# Patient Record
Sex: Female | Born: 1960 | Race: White | Hispanic: No | Smoking: Former smoker
Health system: Southern US, Community
[De-identification: ages and names within clinical notes are randomized; demographics above are authoritative.]

## PROBLEM LIST (undated history)

## (undated) DIAGNOSIS — T4145XA Adverse effect of unspecified anesthetic, initial encounter: Secondary | ICD-10-CM

## (undated) DIAGNOSIS — E0590 Thyrotoxicosis, unspecified without thyrotoxic crisis or storm: Secondary | ICD-10-CM

## (undated) DIAGNOSIS — G43109 Migraine with aura, not intractable, without status migrainosus: Secondary | ICD-10-CM

## (undated) DIAGNOSIS — IMO0002 Reserved for concepts with insufficient information to code with codable children: Secondary | ICD-10-CM

## (undated) DIAGNOSIS — T8859XA Other complications of anesthesia, initial encounter: Secondary | ICD-10-CM

## (undated) DIAGNOSIS — J449 Chronic obstructive pulmonary disease, unspecified: Secondary | ICD-10-CM

## (undated) DIAGNOSIS — F419 Anxiety disorder, unspecified: Secondary | ICD-10-CM

## (undated) DIAGNOSIS — E079 Disorder of thyroid, unspecified: Secondary | ICD-10-CM

## (undated) DIAGNOSIS — Z8489 Family history of other specified conditions: Secondary | ICD-10-CM

## (undated) DIAGNOSIS — G4700 Insomnia, unspecified: Secondary | ICD-10-CM

## (undated) DIAGNOSIS — J40 Bronchitis, not specified as acute or chronic: Secondary | ICD-10-CM

## (undated) HISTORY — DX: Disorder of thyroid, unspecified: E07.9

## (undated) HISTORY — DX: Reserved for concepts with insufficient information to code with codable children: IMO0002

## (undated) HISTORY — DX: Insomnia, unspecified: G47.00

## (undated) HISTORY — PX: CHOLECYSTECTOMY: SHX55

## (undated) HISTORY — DX: Migraine with aura, not intractable, without status migrainosus: G43.109

## (undated) HISTORY — PX: TUBAL LIGATION: SHX77

---

## 2004-06-04 ENCOUNTER — Encounter: Admission: RE | Admit: 2004-06-04 | Discharge: 2004-06-04 | Payer: Self-pay | Admitting: Family Medicine

## 2004-07-03 ENCOUNTER — Encounter: Admission: RE | Admit: 2004-07-03 | Discharge: 2004-08-20 | Payer: Self-pay | Admitting: Family Medicine

## 2005-03-31 ENCOUNTER — Emergency Department (HOSPITAL_COMMUNITY): Admission: EM | Admit: 2005-03-31 | Discharge: 2005-03-31 | Payer: Self-pay | Admitting: Emergency Medicine

## 2005-07-17 ENCOUNTER — Encounter: Admission: RE | Admit: 2005-07-17 | Discharge: 2005-07-17 | Payer: Self-pay | Admitting: *Deleted

## 2005-07-21 ENCOUNTER — Encounter: Admission: RE | Admit: 2005-07-21 | Discharge: 2005-07-21 | Payer: Self-pay | Admitting: Surgery

## 2011-06-16 ENCOUNTER — Emergency Department (HOSPITAL_COMMUNITY)
Admission: EM | Admit: 2011-06-16 | Discharge: 2011-06-16 | Disposition: A | Discharge status: Home Health | Payer: Self-pay | Source: Home / Self Care | Attending: Family Medicine | Admitting: Family Medicine

## 2011-06-16 ENCOUNTER — Encounter (HOSPITAL_COMMUNITY): Payer: Self-pay

## 2011-06-16 ENCOUNTER — Other Ambulatory Visit: Payer: Self-pay

## 2011-06-16 DIAGNOSIS — R000 Tachycardia, unspecified: Secondary | ICD-10-CM

## 2011-06-16 DIAGNOSIS — I498 Other specified cardiac arrhythmias: Secondary | ICD-10-CM

## 2011-06-16 DIAGNOSIS — J029 Acute pharyngitis, unspecified: Secondary | ICD-10-CM

## 2011-06-16 LAB — POCT URINALYSIS DIP (DEVICE)
Bilirubin Urine: NEGATIVE
Glucose, UA: NEGATIVE mg/dL
Nitrite: NEGATIVE
Urobilinogen, UA: 0.2 mg/dL (ref 0.0–1.0)
pH: 6 (ref 5.0–8.0)

## 2011-06-16 LAB — T4, FREE: Free T4: 5.15 ng/dL — ABNORMAL HIGH (ref 0.80–1.80)

## 2011-06-16 MED ORDER — FLUTICASONE PROPIONATE 50 MCG/ACT NA SUSP: 2.0000 | Freq: Every day | NASAL | Status: DC

## 2011-06-16 NOTE — ED Provider Notes (Signed)
Sonya Rodriguez is a 51 y.o. female who presents to Urgent Care today for sore throat for 2 weeks with tachycardia.   1) Sore throat starting initially with stuffy nose and cough. However the nasal congestion is mostly resolved as is the cough. However the throat is still mildly sore. It ws improving but worsened today. [facility] where a rapid strep was negative. However she was found to be tachycardiac and asked to present urgent care.   2) Tachycardia. Notes weeks to months of feeling hot all the time. She also notes dry skin and fragile hair. She denies any trouble swallowing or breathing.    PMH reviewed. Smoked for years until 2 weeks ago. Not a doctor goer.  ROS as above otherwise neg Medications reviewed. No current facility-administered medications for this encounter.   Current Outpatient Prescriptions  Medication Sig Dispense Refill  . acetaminophen (TYLENOL) 500 MG tablet Take 1,000 mg by mouth as needed.      . Pseudoeph-Doxylamine-DM-APAP (NYQUIL PO) Take by mouth as needed.        Exam:  BP 129/73  Pulse 112  Temp(Src) 98.3 F (36.8 C) (Oral)  Resp 22  SpO2 98% Gen: Well NAD HEENT: EOMI,  MMM, post pharynx sig for cobblestoning. No exudate.  Large smooth thyroid symmetric.  Lungs: CTABL Nl WOB Heart: Tachy but regular no MRG Abd: NABS, NT, ND Exts: Non edematous BL  LE, warm and well perfused. Legs equal in size  EKG:  Sinus tach at 112 beats per minute no ST segment changes compared with prior EKG. Urine Dipstick no glucose  Assessment and Plan: 51 yo woman with sore throat likely do to posterior nasal drip as patient has cobblestoning. Plan to treat this with Flonase nasal spray.  More concerning is her sinus tachycardia. Based on her other symptoms I'm suspicious for hyperthyroidism. Doubtful of PE or dehydration as patient is well-hydrated and no cough edema tachypnea or hypoxemia.   Plan to assess with TSH and free T4. Encouraged patient to  followup in 2 weeks or sooner if not improved. Additionally encouraged her to seek a primary care provider and gave her the resources to this.  Discuss red flag signs or symptoms such as trouble breathing fever or chest pain.   Clementeen Graham, MD 06/16/11 1910

## 2011-06-16 NOTE — ED Notes (Signed)
Pt states 2 weeks ago she started with runny nose, headache, head congestion, sore throat, cough and not feeling well.  States she decided to go to [facility] today to make sure she doesn't have strep throat.  States strep test was negative but they told her to come here because her heart rate was elevated.  States she has had diarrhea off and on since being sick and attributed it to nyquil, last had diarrhea on Saturday.  Denies vomiting.  Last had nyquil on Saturday.  Also adds thate this weekend she became SOB while working on her washing machine.  Denies chest pain.

## 2011-06-17 NOTE — ED Provider Notes (Signed)
Medical screening examination/treatment/procedure(s) were performed by non-physician practitioner and as supervising physician I was immediately available for consultation/collaboration.   Mikaylah Libbey DOUGLAS MD.    Lanya Bucks Douglas Aryia Delira, MD 06/17/11 1525 

## 2011-06-19 ENCOUNTER — Telehealth (HOSPITAL_COMMUNITY): Payer: Self-pay | Admitting: *Deleted

## 2011-06-19 NOTE — ED Notes (Signed)
Labs shown to Dr. Artis Flock. He said to call pt. and tell her she is Hyperthyroid and needs get a PCP. I called and verified x 2. The patient was  given the lab results (TSH 0.008 L and Free T 4 5.15 H) and doctors instructions.  She said she is trying to get a job and insurance and can't afford a big bill right now. She asked if it could wait a month. I told her no, because she is having symptoms of tachycardia.  She asked if she needs a specialist.  I told her to get a PCP and if he feels like she needs an endocrinologist, he can refer her. She asked if I can fax the lab results. I told her yes if her doctor can't see them in the system, just call back and I will fax them. Sonya Rodriguez, Sonya Rodriguez 06/19/2011

## 2011-06-20 HISTORY — PX: ORIF DISTAL RADIUS FRACTURE: SUR927

## 2011-06-29 ENCOUNTER — Emergency Department: Payer: Self-pay | Admitting: Emergency Medicine

## 2011-07-08 ENCOUNTER — Ambulatory Visit: Payer: Self-pay | Admitting: Orthopedic Surgery

## 2011-07-08 LAB — PREGNANCY, URINE: Pregnancy Test, Urine: NEGATIVE m[IU]/mL

## 2011-11-05 ENCOUNTER — Ambulatory Visit (HOSPITAL_COMMUNITY)
Admission: RE | Admit: 2011-11-05 | Discharge: 2011-11-05 | Disposition: A | Discharge status: Home / Self Care | Payer: Managed Care, Other (non HMO) | Source: Ambulatory Visit | Attending: Family Medicine | Admitting: Family Medicine

## 2011-11-05 ENCOUNTER — Other Ambulatory Visit (HOSPITAL_COMMUNITY): Payer: Self-pay | Admitting: Family Medicine

## 2011-11-05 ENCOUNTER — Other Ambulatory Visit: Payer: Self-pay

## 2011-11-05 DIAGNOSIS — R000 Tachycardia, unspecified: Secondary | ICD-10-CM | POA: Insufficient documentation

## 2011-11-05 DIAGNOSIS — R52 Pain, unspecified: Secondary | ICD-10-CM | POA: Insufficient documentation

## 2011-11-05 DIAGNOSIS — J449 Chronic obstructive pulmonary disease, unspecified: Secondary | ICD-10-CM | POA: Insufficient documentation

## 2011-11-05 DIAGNOSIS — J4489 Other specified chronic obstructive pulmonary disease: Secondary | ICD-10-CM | POA: Insufficient documentation

## 2011-11-05 DIAGNOSIS — Z87891 Personal history of nicotine dependence: Secondary | ICD-10-CM | POA: Insufficient documentation

## 2011-11-05 DIAGNOSIS — Z0000 Encounter for general adult medical examination without abnormal findings: Secondary | ICD-10-CM

## 2011-11-05 LAB — COMPREHENSIVE METABOLIC PANEL
AST: 26 U/L (ref 0–37)
Albumin: 4 g/dL (ref 3.5–5.2)
BUN: 14 mg/dL (ref 6–23)
Chloride: 106 mEq/L (ref 96–112)
Creatinine, Ser: 0.43 mg/dL — ABNORMAL LOW (ref 0.50–1.10)
Total Bilirubin: 0.3 mg/dL (ref 0.3–1.2)
Total Protein: 7.2 g/dL (ref 6.0–8.3)

## 2011-11-05 LAB — LIPID PANEL
Cholesterol: 169 mg/dL (ref 0–200)
HDL: 50 mg/dL (ref >39)
LDL Cholesterol: 95 mg/dL (ref 0–99)
Total CHOL/HDL Ratio: 3.4 RATIO
Triglycerides: 120 mg/dL (ref <150)
VLDL: 24 mg/dL (ref 0–40)

## 2011-11-05 LAB — CBC
HCT: 40.4 % (ref 36.0–46.0)
MCHC: 34.2 g/dL (ref 30.0–36.0)
MCV: 79.8 fL (ref 78.0–100.0)
Platelets: 240 10*3/uL (ref 150–400)
RDW: 12.8 % (ref 11.5–15.5)
WBC: 5.3 10*3/uL (ref 4.0–10.5)

## 2011-11-12 ENCOUNTER — Other Ambulatory Visit: Payer: Self-pay | Admitting: Family Medicine

## 2011-11-12 DIAGNOSIS — E049 Nontoxic goiter, unspecified: Secondary | ICD-10-CM

## 2011-11-14 ENCOUNTER — Ambulatory Visit
Admission: RE | Admit: 2011-11-14 | Discharge: 2011-11-14 | Disposition: A | Discharge status: Home / Self Care | Payer: 59 | Source: Ambulatory Visit | Attending: Family Medicine | Admitting: Family Medicine

## 2011-11-14 DIAGNOSIS — E049 Nontoxic goiter, unspecified: Secondary | ICD-10-CM

## 2014-08-13 NOTE — Op Note (Signed)
PATIENT NAME:  Sonya FishRANDALL, [name] MR#:  [id number] DATE OF BIRTH:  10-21-1960  DATE OF PROCEDURE:  07/08/2011  PREOPERATIVE DIAGNOSIS: Distal radius fracture, extra-articular.   POSTOPERATIVE DIAGNOSIS: Distal radius fracture, extra-articular.  PROCEDURE: Open reduction internal fixation left distal radius.   ANESTHESIA: General.    SURGEON: Leitha SchullerMichael J. Kynsley Whitehouse, MD   DESCRIPTION OF PROCEDURE: The patient was brought to the operating room and after adequate anesthesia was obtained the left arm was prepped and draped in the usual sterile fashion with a tourniquet applied to the upper arm. After patient identification and time-out procedures were completed, fingertrap traction through the index and middle finger were applied with 10 pounds of traction over the end of the bed. The tourniquet was raised at this point and a volar incision was made over the FCR tendon. The tendon retracted ulnarly, radial artery protected radially. The pronator was then elevated off the distal radius and the fracture site identified. A small narrow plate appeared to give the best fit and this was applied to the distal fragment with multiple peg holes being filled after first taking it and placing a K wire to hold it in appropriate position. After all the distal pegs had been placed using standard technique, drilling, removing the drill guide sleeve, and then placing the smooth pegs the proximal portion of the plate was fixed to the radius using multiple 10 mm cortical screws. This gave anatomic alignment on AP and lateral projections with no pin penetration into the joint. The traction was released and the tourniquet let down. The wound was thoroughly irrigated. The wound was closed with 2-0 Vicryl deep and subcutaneous and 4-0 nylon. Xeroform, 4 x 4's, Webril, and a volar splint were applied. The patient was sent to the recovery room in stable condition.   ESTIMATED BLOOD LOSS: Minimal.   COMPLICATIONS: None.   SPECIMEN: None.    IMPLANT: [employer] left short narrow DVR plate with pegs and screws.   TOURNIQUET TIME: 25 minutes at 250 mmHg.   ____________________________ Leitha SchullerMichael J. Divinity Kyler, MD mjm:drc D: 07/08/2011 17:41:55 ET T: 07/09/2011 09:12:59 ET JOB#: [id number]  cc: Leitha SchullerMichael J. Ciella Obi, MD, <Dictator> Leitha SchullerMICHAEL J Jasa Dundon MD ELECTRONICALLY SIGNED 07/09/2011 17:12

## 2014-11-14 ENCOUNTER — Ambulatory Visit: Payer: 59 | Admitting: Primary Care

## 2014-11-20 ENCOUNTER — Encounter: Payer: Self-pay | Admitting: Primary Care

## 2014-11-20 ENCOUNTER — Ambulatory Visit (INDEPENDENT_AMBULATORY_CARE_PROVIDER_SITE_OTHER): Payer: Commercial Managed Care - HMO | Admitting: Primary Care

## 2014-11-20 ENCOUNTER — Encounter (INDEPENDENT_AMBULATORY_CARE_PROVIDER_SITE_OTHER): Payer: Self-pay

## 2014-11-20 VITALS — BP 122/82 | HR 95 | Temp 97.5°F | Ht 63.75 in | Wt 145.8 lb

## 2014-11-20 DIAGNOSIS — Z8639 Personal history of other endocrine, nutritional and metabolic disease: Secondary | ICD-10-CM

## 2014-11-20 DIAGNOSIS — Z720 Tobacco use: Secondary | ICD-10-CM | POA: Diagnosis not present

## 2014-11-20 DIAGNOSIS — G43101 Migraine with aura, not intractable, with status migrainosus: Secondary | ICD-10-CM | POA: Diagnosis not present

## 2014-11-20 DIAGNOSIS — G43909 Migraine, unspecified, not intractable, without status migrainosus: Secondary | ICD-10-CM | POA: Insufficient documentation

## 2014-11-20 DIAGNOSIS — E0590 Thyrotoxicosis, unspecified without thyrotoxic crisis or storm: Secondary | ICD-10-CM | POA: Insufficient documentation

## 2014-11-20 LAB — TSH: TSH: 0.04 u[IU]/mL — AB (ref 0.35–4.50)

## 2014-11-20 MED ORDER — VARENICLINE TARTRATE 0.5 MG X 11 & 1 MG X 42 PO MISC: ORAL | Status: DC

## 2014-11-20 MED ORDER — VARENICLINE TARTRATE 1 MG PO TABS: 1.0000 mg | ORAL_TABLET | Freq: Two times a day (BID) | ORAL | Status: DC

## 2014-11-20 NOTE — Progress Notes (Signed)
Pre visit review using our clinic review tool, if applicable. No additional management support is needed unless otherwise documented below in the visit note. 

## 2014-11-20 NOTE — Assessment & Plan Note (Signed)
Was once told her thyroid needed to be removed. TSH today for further evaluation. Last evaluation was in 2013. Asymptomatic.

## 2014-11-20 NOTE — Progress Notes (Signed)
Subjective:    Patient ID: Sonya Rodriguez, female    DOB: [birth year], 54 y.o.   MRN: [id number]  HPI  Sonya Rodriguez is a 54 year old female who presents today to establish care and discuss the problems mentioned below. Will obtain old records.  1) Migraine: Diagnosed years ago. Intermittent. Over the past year they have increased in frequency. She will now get them three times weekly on average. She is aware of her triggers. She takes motrin and aspirin with some resolve. Overall she will have an interference in her day with recent migraines. She will experience photosensitivity and become nauseated. She will get an aura of seeing "lightening". She's never been on preventative medication for migraines. Denies anxiety.  2) Insomnia: Present most of her life. She is currently taking melatonin, benadryl, or unasum. Without these medications she will not be able to sleep. She was once trialed on Lunesta without relief.   3) Thyroid problem: Was told that her thyroid was overactive and that she needed her thyroid removed. She then went to another MD who told her that her thyroid levels were normal. She last had her thyroid measured in 2013. Denies palpitations, dizziness, weight loss.  4) Tobacco abuse: Is interested in smoking cessation by using Chantix. She smokes cigarettes and has smoked off and on for 30 years. She is currently smoking less than 1 PPD. She will buy three packs per week.   Review of Systems  Constitutional: Negative for unexpected weight change.  HENT: Negative for rhinorrhea.   Respiratory:       SOB upon exertion occasionally. Some cough in the morning  Cardiovascular: Negative for chest pain.  Gastrointestinal: Negative for diarrhea and constipation.  Genitourinary: Negative for difficulty urinating.  Musculoskeletal: Negative for myalgias.       Chronic knee pain  Skin: Negative for rash.  Allergic/Immunologic: Positive for environmental allergies.  Neurological:  Positive for headaches. Negative for dizziness and numbness.  Psychiatric/Behavioral:       Denies concerns for anxiety or depression       Past Medical History  Diagnosis Date  . Migraines   . Insomnia     History   Social History  . Marital Status: Divorced    Spouse Name: N/A  . Number of Children: N/A  . Years of Education: N/A   Occupational History  . Not on file.   Social History Main Topics  . Smoking status: Current Every Day Smoker  . Smokeless tobacco: Not on file  . Alcohol Use: No  . Drug Use: No  . Sexual Activity: Not on file   Other Topics Concern  . Not on file   Social History Narrative   Divorced.   3 children.   Works as a Sales promotion account executive.   Highest level of education: College.   Enjoys Spending time with her children, yard work.    Past Surgical History  Procedure Laterality Date  . Cholecystectomy      Family History  Problem Relation Age of Onset  . Hyperlipidemia Mother   . Mental illness Mother   . Cancer Father     prostate  . Drug abuse Maternal Grandmother     No Known Allergies  Current Outpatient Prescriptions on File Prior to Visit  Medication Sig Dispense Refill  . Pseudoeph-Doxylamine-DM-APAP (NYQUIL PO) Take by mouth as needed.     No current facility-administered medications on file prior to visit.    BP 122/82 mmHg  Pulse 95  Temp(Src) 97.5 F (36.4 C) (Oral)  Ht 5' 3.75" (1.619 m)  Wt 145 lb 12.8 oz (66.134 kg)  BMI 25.23 kg/m2  SpO2 97%    Objective:   Physical Exam  Constitutional: She is oriented to person, place, and time. She appears well-nourished.  HENT:  Head: Normocephalic.  Cardiovascular: Normal rate and regular rhythm.   Pulmonary/Chest: Effort normal and breath sounds normal.  Musculoskeletal: Normal range of motion.  Neurological: She is alert and oriented to person, place, and time.  Skin: Skin is warm and dry.  Psychiatric: She has a normal mood and affect.            Assessment & Plan:

## 2014-11-20 NOTE — Patient Instructions (Signed)
Complete lab work prior to leaving today. I will notify you of your results.  Start Chantix Starting pack and then continue with the continuing pack.   Follow up in 4 weeks for re-evaluation of tobacco cessation.   It was a pleasure to meet you today! Please don't hesitate to call me with any questions. Welcome to [facility]!

## 2014-11-20 NOTE — Assessment & Plan Note (Signed)
Present for years, now more frequent. Will take motrin with some resolve. Aura with visions of "lightening", photophobia, and nausea. She has not tried preventative medication. Will consider next visit as she is starting Chantix this visit.

## 2014-11-20 NOTE — Assessment & Plan Note (Signed)
Smokes cigarettes, less than 1 PPD for past 30 years intermittently. Interested in quitting today, would like to try Chantix. RX for chantix sent to pharmacy with explanations on side effects and how to start. Follow up in 4 weeks.

## 2014-11-21 ENCOUNTER — Other Ambulatory Visit (INDEPENDENT_AMBULATORY_CARE_PROVIDER_SITE_OTHER): Payer: Commercial Managed Care - HMO

## 2014-11-21 ENCOUNTER — Other Ambulatory Visit: Payer: Self-pay | Admitting: Primary Care

## 2014-11-21 DIAGNOSIS — Z8639 Personal history of other endocrine, nutritional and metabolic disease: Secondary | ICD-10-CM | POA: Diagnosis not present

## 2014-11-21 DIAGNOSIS — E0590 Thyrotoxicosis, unspecified without thyrotoxic crisis or storm: Secondary | ICD-10-CM

## 2014-11-21 LAB — T4, FREE: FREE T4: 2.95 ng/dL — AB (ref 0.60–1.60)

## 2014-11-21 LAB — T3, FREE: T3, Free: 9.1 pg/mL — ABNORMAL HIGH (ref 2.3–4.2)

## 2014-11-28 ENCOUNTER — Ambulatory Visit
Admission: RE | Admit: 2014-11-28 | Discharge: 2014-11-28 | Disposition: A | Discharge status: Home / Self Care | Payer: Commercial Managed Care - HMO | Source: Ambulatory Visit | Attending: Primary Care | Admitting: Primary Care

## 2014-11-28 DIAGNOSIS — E0590 Thyrotoxicosis, unspecified without thyrotoxic crisis or storm: Secondary | ICD-10-CM

## 2014-11-28 DIAGNOSIS — E041 Nontoxic single thyroid nodule: Secondary | ICD-10-CM | POA: Insufficient documentation

## 2014-11-29 ENCOUNTER — Other Ambulatory Visit: Payer: Self-pay | Admitting: Primary Care

## 2014-11-29 DIAGNOSIS — E042 Nontoxic multinodular goiter: Secondary | ICD-10-CM

## 2014-12-15 ENCOUNTER — Ambulatory Visit: Payer: Commercial Managed Care - HMO | Admitting: Primary Care

## 2014-12-15 ENCOUNTER — Other Ambulatory Visit: Payer: Self-pay | Admitting: Primary Care

## 2014-12-18 NOTE — Telephone Encounter (Signed)
Electronically refill request  Last prescribed on 4 weeks ago (11/20/2014) varenicline (CHANTIX STARTING MONTH PAK) 0.5 MG X 11 & 1 MG X 42 tablet Dispense: 53 tablet   Refill: 0  Last seen on 11/20/2014. Next apt on 12/22/2014.

## 2014-12-22 ENCOUNTER — Encounter: Payer: Self-pay | Admitting: Primary Care

## 2014-12-22 ENCOUNTER — Ambulatory Visit (INDEPENDENT_AMBULATORY_CARE_PROVIDER_SITE_OTHER): Payer: Commercial Managed Care - HMO | Admitting: Primary Care

## 2014-12-22 VITALS — BP 126/80 | HR 108 | Temp 97.4°F | Ht 64.0 in | Wt 144.1 lb

## 2014-12-22 DIAGNOSIS — Z720 Tobacco use: Secondary | ICD-10-CM | POA: Diagnosis not present

## 2014-12-22 DIAGNOSIS — Z8639 Personal history of other endocrine, nutritional and metabolic disease: Secondary | ICD-10-CM | POA: Diagnosis not present

## 2014-12-22 MED ORDER — BUPROPION HCL ER (SR) 150 MG PO TB12: 150.0000 mg | ORAL_TABLET | Freq: Two times a day (BID) | ORAL | Status: DC

## 2014-12-22 NOTE — Assessment & Plan Note (Addendum)
TSH low with elevated free T3 and T4. US pKoreareformed and is now to see endocrinology. Symptoms of heat intolerance, headaches, palpitations 2 weeks ago. No thyroid enlargement noted on exam. She is to see Dr. Tedd Sias in one week.

## 2014-12-22 NOTE — Progress Notes (Signed)
   Subjective:    Patient ID: Sonya Rodriguez, female    DOB: 1961-04-18, 54 y.o.   MRN: [id number]  HPI  Sonya Rodriguez is a 54 year old female who presents today for follow up for tobacco abuse. She was evaluated on 11/20/14 as a new patient and expressed intereset in smoking cesstion. She smokes cigarettes and smoked off and on for 30 years. She was initiated on Chantix on 8/1.  Since initiation of Chantix she developed nightmares and aggravation. She stopped taking the Chantix this past Sunday. She is still interested in quitting and would like to try Wellbutrin.  2) Hyperthyroidism: Free T3 and Free T4 elevated with low TSH. Symptoms of heat intolerance, palpitations, headaches. She completed her ultrasound last week and is to follow up with endocrinology in 2 weeks for further evaluation.   Review of Systems  Respiratory: Negative for cough and shortness of breath.   Cardiovascular: Negative for chest pain.       No recent palpitations  Endocrine: Positive for heat intolerance.  Neurological: Positive for headaches. Negative for dizziness.       Past Medical History  Diagnosis Date  . Migraines   . Insomnia     Social History   Social History  . Marital Status: Divorced    Spouse Name: N/A  . Number of Children: N/A  . Years of Education: N/A   Occupational History  . Not on file.   Social History Main Topics  . Smoking status: Current Every Day Smoker  . Smokeless tobacco: Not on file  . Alcohol Use: No  . Drug Use: No  . Sexual Activity: Not on file   Other Topics Concern  . Not on file   Social History Narrative   Divorced.   3 children.   Works as a Sales promotion account executive.   Highest level of education: College.   Enjoys Spending time with her children, yard work.    Past Surgical History  Procedure Laterality Date  . Cholecystectomy      Family History  Problem Relation Age of Onset  . Hyperlipidemia Mother   . Mental illness Mother   . Cancer  Father     prostate  . Drug abuse Maternal Grandmother     No Known Allergies  Current Outpatient Prescriptions on File Prior to Visit  Medication Sig Dispense Refill  . Pseudoeph-Doxylamine-DM-APAP (NYQUIL PO) Take by mouth as needed.     No current facility-administered medications on file prior to visit.    BP 126/80 mmHg  Pulse 108  Temp(Src) 97.4 F (36.3 C) (Oral)  Ht  (1.626 m)  Wt 144 lb 1.9 oz (65.372 kg)  BMI 24.73 kg/m2  SpO2 99%    Objective:   Physical Exam  Constitutional: She appears well-nourished.  Neck: Neck supple. No thyromegaly present.  Cardiovascular: Normal rate and regular rhythm.   Pulmonary/Chest: Effort normal and breath sounds normal.  Skin: Skin is warm and dry.  Psychiatric: She has a normal mood and affect.          Assessment & Plan:

## 2014-12-22 NOTE — Patient Instructions (Signed)
Start Bupropion tablets for smoking cessation. Take 1 tablet by mouth daily for 3 days, then take 1 tablet by mouth twice daily starting on day 4.  Treatment currently lasts for 7 to 12 weeks.  Follow up in 4 weeks for re-evaluation.  It was a pleasure to see you today!

## 2014-12-22 NOTE — Progress Notes (Signed)
Pre visit review using our clinic review tool, if applicable. No additional management support is needed unless otherwise documented below in the visit note. 

## 2015-01-25 ENCOUNTER — Ambulatory Visit: Payer: Commercial Managed Care - HMO | Admitting: Primary Care

## 2015-01-25 ENCOUNTER — Encounter: Payer: Self-pay | Admitting: Primary Care

## 2015-01-25 ENCOUNTER — Ambulatory Visit (INDEPENDENT_AMBULATORY_CARE_PROVIDER_SITE_OTHER): Payer: Commercial Managed Care - HMO | Admitting: Primary Care

## 2015-01-25 VITALS — BP 122/76 | HR 84 | Temp 98.0°F | Ht 64.0 in | Wt 147.8 lb

## 2015-01-25 DIAGNOSIS — Z720 Tobacco use: Secondary | ICD-10-CM | POA: Diagnosis not present

## 2015-01-25 MED ORDER — VARENICLINE TARTRATE 0.5 MG X 11 & 1 MG X 42 PO MISC: ORAL | Status: DC

## 2015-01-25 NOTE — Patient Instructions (Signed)
Please give me a call regarding Chantix. I do not advise that you take Wellbutrin and Chantix together.  Please schedule a physical with me within the next 6 months at your convenience. You will also schedule a lab only appointment one week prior. We will discuss your lab results during your physical.  It was a pleasure to see you today!

## 2015-01-25 NOTE — Progress Notes (Signed)
Pre visit review using our clinic review tool, if applicable. No additional management support is needed unless otherwise documented below in the visit note. 

## 2015-01-25 NOTE — Progress Notes (Signed)
Called in Chantix to [facility].

## 2015-01-25 NOTE — Assessment & Plan Note (Signed)
Did not like Chantix last visit as she experienced side effects of nightmares and aggravation. She was switched to Wellbutrin last visit. She has had no improvement on Wellbutrin and would like to try Chantix with Wellbutrin. She refuses to try the patches or gum. I strongly encouraged her not to try Wellbutrin with Chantix. She threw away the starting pack and would like another sent to her pharmacy. Sent starting pack to [facility] and information provided regarding side effects and instructions on how to take.

## 2015-01-25 NOTE — Progress Notes (Signed)
   Subjective:    Patient ID: Sonya Rodriguez, female    DOB: 1960-12-27, 54 y.o.   MRN: [id number]  HPI  [name] is a 54 year old female who presents today for follow up of smoking cessation. She was re-evaluated one month ago for tobacco abuse as she had started Chantix for assistance with smoking cessation. She couldn't tolerate the Chantix as she developed nightmares and aggravation. She was initiated on Wellbutrin SR 150 mg last visit.   Since her last visit she believes that the Wellbutrin has not helped with tobacco cessation. She is very frustrated and would like to give Chantix a try again. She believes she threw away the starting pack of Chantix and would like another one sent to her pharmacy. She believes she still has the continuing pack at [facility]. Despite the side effects of nightmares and aggravation she would like to restart as she is very determined to quit. She does not wish to try patches, gum, or lozenges.    2) Hyperthyroidism: Found to have a low TSH with elevated Free T3 and Free T4 during our initial visit in August. Thyroid US with multiple bilateral nodules present on 11/28/2014. She has since seen endocrinology and initiated on methimazole 10 mg. She has follow up scheduled in 2 weeks.  Review of Systems  Respiratory: Negative for shortness of breath.   Cardiovascular: Negative for chest pain and palpitations.  Neurological: Negative for dizziness and headaches.       Past Medical History  Diagnosis Date  . Migraines   . Insomnia     Social History   Social History  . Marital Status: Divorced    Spouse Name: N/A  . Number of Children: N/A  . Years of Education: N/A   Occupational History  . Not on file.   Social History Main Topics  . Smoking status: Current Every Day Smoker  . Smokeless tobacco: Not on file  . Alcohol Use: No  . Drug Use: No  . Sexual Activity: Not on file   Other Topics Concern  . Not on file   Social History Narrative   Divorced.   3 children.   Works as a Sales promotion account executive.   Highest level of education: College.   Enjoys Spending time with her children, yard work.    Past Surgical History  Procedure Laterality Date  . Cholecystectomy      Family History  Problem Relation Age of Onset  . Hyperlipidemia Mother   . Mental illness Mother   . Cancer Father     prostate  . Drug abuse Maternal Grandmother     No Known Allergies  Current Outpatient Prescriptions on File Prior to Visit  Medication Sig Dispense Refill  . Pseudoeph-Doxylamine-DM-APAP (NYQUIL PO) Take by mouth as needed.     No current facility-administered medications on file prior to visit.    BP 122/76 mmHg  Pulse 84  Temp(Src) 98 F (36.7 C) (Oral)  Ht  (1.626 m)  Wt 147 lb 12.8 oz (67.042 kg)  BMI 25.36 kg/m2  SpO2 98%    Objective:   Physical Exam  Constitutional: She appears well-nourished.  Cardiovascular: Normal rate and regular rhythm.   Pulmonary/Chest: Effort normal and breath sounds normal.  Skin: Skin is warm and dry.  Psychiatric:  Very aggravated that the Wellbutrin did not help to improve symptoms.          Assessment & Plan:

## 2015-02-08 ENCOUNTER — Telehealth: Payer: Self-pay | Admitting: Primary Care

## 2015-02-08 NOTE — Telephone Encounter (Signed)
Will you please check on Sonya Rodriguez? Please tell her that I'd like to know how she's doing with the Chantix. Thanks.

## 2015-02-08 NOTE — Telephone Encounter (Signed)
Called and asked patient regarding Chantix. Patient stated that Chantix is working much better this time. Patient wanted to let us know that when she request a refill it would have to go thru [facility]. I let her know we will watch out for it.

## 2015-02-08 NOTE — Telephone Encounter (Signed)
Absolutely, will you add [facility] to her pharmacy list and put is as her primary for now? Thanks.

## 2015-02-09 NOTE — Telephone Encounter (Signed)
Added [facility] to her pharmacy list

## 2015-02-14 ENCOUNTER — Other Ambulatory Visit: Payer: Self-pay | Admitting: Primary Care

## 2015-02-14 DIAGNOSIS — Z720 Tobacco use: Secondary | ICD-10-CM

## 2015-02-14 MED ORDER — VARENICLINE TARTRATE 1 MG PO TABS: 1.0000 mg | ORAL_TABLET | Freq: Two times a day (BID) | ORAL | Status: DC

## 2015-04-28 ENCOUNTER — Emergency Department (HOSPITAL_COMMUNITY)
Admission: EM | Admit: 2015-04-28 | Discharge: 2015-04-28 | Disposition: A | Discharge status: Home / Self Care | Payer: Commercial Managed Care - HMO | Attending: Emergency Medicine | Admitting: Emergency Medicine

## 2015-04-28 ENCOUNTER — Encounter (HOSPITAL_COMMUNITY): Payer: Self-pay | Admitting: Emergency Medicine

## 2015-04-28 DIAGNOSIS — Z8669 Personal history of other diseases of the nervous system and sense organs: Secondary | ICD-10-CM | POA: Diagnosis not present

## 2015-04-28 DIAGNOSIS — K029 Dental caries, unspecified: Secondary | ICD-10-CM | POA: Insufficient documentation

## 2015-04-28 DIAGNOSIS — Z79899 Other long term (current) drug therapy: Secondary | ICD-10-CM | POA: Insufficient documentation

## 2015-04-28 DIAGNOSIS — K047 Periapical abscess without sinus: Secondary | ICD-10-CM | POA: Insufficient documentation

## 2015-04-28 DIAGNOSIS — G43909 Migraine, unspecified, not intractable, without status migrainosus: Secondary | ICD-10-CM | POA: Diagnosis not present

## 2015-04-28 DIAGNOSIS — F17200 Nicotine dependence, unspecified, uncomplicated: Secondary | ICD-10-CM | POA: Diagnosis not present

## 2015-04-28 DIAGNOSIS — K0889 Other specified disorders of teeth and supporting structures: Secondary | ICD-10-CM | POA: Diagnosis present

## 2015-04-28 MED ORDER — HYDROCODONE-ACETAMINOPHEN 5-325 MG PO TABS: 1.0000 | ORAL_TABLET | Freq: Four times a day (QID) | ORAL | Status: DC | PRN

## 2015-04-28 MED ORDER — PENICILLIN V POTASSIUM 500 MG PO TABS: 500.0000 mg | ORAL_TABLET | Freq: Three times a day (TID) | ORAL | Status: DC

## 2015-04-28 NOTE — ED Notes (Signed)
Pt left at this time with all belongings.  

## 2015-04-28 NOTE — ED Notes (Signed)
Pt arrives POV with c/o of losing crown on R lower tooth on Wednesday. States she made an appointment with dentist on Monday but pain is unbearable and she reports swelling. Ibuprofen not effective, hydrogen peroxide rinse not effective.

## 2015-04-28 NOTE — Discharge Instructions (Signed)

## 2015-04-28 NOTE — ED Provider Notes (Signed)
CSN: [id number]     Arrival date & time 04/28/15  1911 History  By signing my name below, I, Lyndel SafeKaitlyn Shelton, attest that this documentation has been prepared under the direction and in the presence of Fayrene HelperBowie Zacharia Sowles, PA-C.  Electronically Signed: Lyndel SafeKaitlyn Shelton, ED Scribe. 04/28/2015. 7:25 PM.   Chief Complaint  Patient presents with  . Dental Pain   The history is provided by the patient. No language interpreter was used.   HPI Comments: Sonya Rodriguez is a 55 y.o. female who presents to the Emergency Department complaining of sudden onset, constant, severe right lower dentalgia with progressively worsening right-sided facial swelling onset this afternoon. The pt reports her dental pain is worse with lying flat. She has been ibuprofen with temporary relief. Pt has contacted her dentist and has a follow up appointment scheduled in 2 days. Denies fevers, chills, or any other associated symptoms.   Past Medical History  Diagnosis Date  . Migraines   . Insomnia    Past Surgical History  Procedure Laterality Date  . Cholecystectomy     Family History  Problem Relation Age of Onset  . Hyperlipidemia Mother   . Mental illness Mother   . Cancer Father     prostate  . Drug abuse Maternal Grandmother    Social History  Substance Use Topics  . Smoking status: Current Every Day Smoker -- 0.50 packs/day  . Smokeless tobacco: None  . Alcohol Use: No   OB History    No data available     Review of Systems  Constitutional: Negative for fever and chills.  HENT: Positive for dental problem and facial swelling.    Allergies  Review of patient's allergies indicates no known allergies.  Home Medications   Prior to Admission medications   Medication Sig Start Date End Date Taking? Authorizing Provider  methimazole (TAPAZOLE) 10 MG tablet Take by mouth. 01/03/15 01/03/16  Historical Provider, MD  propranolol (INDERAL) 20 MG tablet Take by mouth. 01/03/15 01/03/16  Historical Provider, MD   Pseudoeph-Doxylamine-DM-APAP (NYQUIL PO) Take by mouth as needed.    Historical Provider, MD  varenicline (CHANTIX CONTINUING MONTH PAK) 1 MG tablet Take 1 tablet (1 mg total) by mouth 2 (two) times daily. 02/14/15   Doreene NestKatherine K Clark, NP  varenicline (CHANTIX STARTING MONTH PAK) 0.5 MG X 11 & 1 MG X 42 tablet Take one 0.5 mg tablet by mouth once daily for 3 days, then increase to one 0.5 mg tablet twice daily for 4 days, then increase to one 1 mg tablet twice daily. 01/25/15   Doreene NestKatherine K Clark, NP   BP 139/63 mmHg  Pulse 82  Temp(Src) 97.8 F (36.6 C) (Oral)  Resp 18  SpO2 100% Physical Exam  Constitutional: She is oriented to person, place, and time. She appears well-developed and well-nourished. No distress.  HENT:  Head: Normocephalic.  Mouth/Throat: Uvula is midline, oropharynx is clear and moist and mucous membranes are normal. No trismus in the jaw. Abnormal dentition. Dental caries present.  Significant dental decay noted to tooth #30 with tenderness to gingiva and adjacent swelling noted to right mandible; no trismus.   Eyes: Conjunctivae are normal.  Neck: Normal range of motion. Neck supple.  Cardiovascular: Normal rate.   Pulmonary/Chest: Effort normal. No respiratory distress.  Musculoskeletal: Normal range of motion.  Neurological: She is alert and oriented to person, place, and time. Coordination normal.  Skin: Skin is warm.  Psychiatric: She has a normal mood and affect. Her behavior is  normal.  Nursing note and vitals reviewed.   ED Course  Procedures  DIAGNOSTIC STUDIES: Oxygen Saturation is 100% on RA, normal by my interpretation.    COORDINATION OF CARE: 7:24 PM Discussed treatment plan which includes to prescribe penicillin and norco with pt. Pt acknowledges and agrees to plan.    MDM   Final diagnoses:  Periapical abscess with facial involvement    Patient with dentalgia.  No abscess requiring immediate incision and drainage.  Exam not concerning for  Ludwig's angina or pharyngeal abscess.  Will treat with penicillin course and norco for pain management. Pt has a dental follow up scheduled in 2 days with her dentist.  Discussed return precautions. Pt safe for discharge.   I personally performed the services described in this documentation, which was scribed in my presence. The recorded information has been reviewed and is accurate.   BP 139/63 mmHg  Pulse 82  Temp(Src) 97.8 F (36.6 C) (Oral)  Resp 18  SpO2 100%    Fayrene Helper, PA-C 04/29/15 0044  Melene Plan, DO 04/29/15 1400

## 2015-05-14 ENCOUNTER — Other Ambulatory Visit: Payer: Self-pay | Admitting: Primary Care

## 2015-05-14 DIAGNOSIS — Z720 Tobacco use: Secondary | ICD-10-CM

## 2015-05-14 MED ORDER — VARENICLINE TARTRATE 1 MG PO TABS: 1.0000 mg | ORAL_TABLET | Freq: Two times a day (BID) | ORAL | Status: DC

## 2015-05-14 NOTE — Telephone Encounter (Signed)
Received refill request from [facility] for   varenicline (CHANTIX CONTINUING MONTH PAK) 1 MG tablet  Take 1 tablet (1 mg total) by mouth 2 (two) times daily.   Dispense:  60      Refill:   1   Last prescribed on 02/14/2015. Last seen on 01/25/2015. No future appointment.  Looks like patient change from [facility] to [facility].

## 2015-05-15 NOTE — Telephone Encounter (Signed)
Called and spoken to patient. She stated that she stop taking Chantix for while when her father passed a way last month. She is going to start again.

## 2015-06-26 ENCOUNTER — Other Ambulatory Visit: Payer: Self-pay | Admitting: Internal Medicine

## 2015-06-26 DIAGNOSIS — E0520 Thyrotoxicosis with toxic multinodular goiter without thyrotoxic crisis or storm: Secondary | ICD-10-CM

## 2015-07-04 ENCOUNTER — Ambulatory Visit: Payer: Commercial Managed Care - HMO

## 2015-07-04 ENCOUNTER — Encounter
Admission: RE | Admit: 2015-07-04 | Discharge: 2015-07-04 | Disposition: A | Discharge status: Home / Self Care | Payer: Commercial Managed Care - HMO | Source: Ambulatory Visit | Attending: Internal Medicine | Admitting: Internal Medicine

## 2015-07-05 ENCOUNTER — Other Ambulatory Visit: Payer: Commercial Managed Care - HMO

## 2015-08-20 ENCOUNTER — Other Ambulatory Visit: Payer: Self-pay | Admitting: Primary Care

## 2015-08-20 DIAGNOSIS — Z720 Tobacco use: Secondary | ICD-10-CM

## 2015-08-20 DIAGNOSIS — IMO0002 Reserved for concepts with insufficient information to code with codable children: Secondary | ICD-10-CM

## 2015-08-20 HISTORY — DX: Reserved for concepts with insufficient information to code with codable children: IMO0002

## 2015-08-20 MED ORDER — VARENICLINE TARTRATE 1 MG PO TABS: 1.0000 mg | ORAL_TABLET | Freq: Two times a day (BID) | ORAL | Status: DC

## 2015-08-20 NOTE — Telephone Encounter (Signed)
Received refill fax request for varenicline (CHANTIX CONTINUING MONTH PAK) 1 MG tablet.  Last prescribed on 05/14/2015. Last seen on 01/25/2016. No future appointment.

## 2015-09-19 ENCOUNTER — Encounter: Payer: Self-pay | Admitting: Obstetrics and Gynecology

## 2015-09-19 ENCOUNTER — Ambulatory Visit (INDEPENDENT_AMBULATORY_CARE_PROVIDER_SITE_OTHER): Payer: Commercial Managed Care - HMO | Admitting: Obstetrics and Gynecology

## 2015-09-19 VITALS — BP 120/72 | HR 72 | Resp 16 | Ht 63.5 in | Wt 158.8 lb

## 2015-09-19 DIAGNOSIS — Z01419 Encounter for gynecological examination (general) (routine) without abnormal findings: Secondary | ICD-10-CM | POA: Diagnosis not present

## 2015-09-19 DIAGNOSIS — B372 Candidiasis of skin and nail: Secondary | ICD-10-CM | POA: Diagnosis not present

## 2015-09-19 DIAGNOSIS — N8110 Cystocele, unspecified: Secondary | ICD-10-CM

## 2015-09-19 DIAGNOSIS — IMO0002 Reserved for concepts with insufficient information to code with codable children: Secondary | ICD-10-CM

## 2015-09-19 DIAGNOSIS — R3915 Urgency of urination: Secondary | ICD-10-CM

## 2015-09-19 DIAGNOSIS — Z124 Encounter for screening for malignant neoplasm of cervix: Secondary | ICD-10-CM

## 2015-09-19 DIAGNOSIS — G43109 Migraine with aura, not intractable, without status migrainosus: Secondary | ICD-10-CM | POA: Insufficient documentation

## 2015-09-19 MED ORDER — NYSTATIN 100000 UNIT/GM EX CREA: 1.0000 "application " | TOPICAL_CREAM | Freq: Two times a day (BID) | CUTANEOUS | Status: DC

## 2015-09-19 NOTE — Patient Instructions (Addendum)
EXERCISE AND DIET:  We recommended that you start or continue a regular exercise program for good health. Regular exercise means any activity that makes your heart beat faster and makes you sweat.  We recommend exercising at least 30 minutes per day at least 3 days a week, preferably 4 or 5.  We also recommend a diet low in fat and sugar.  Inactivity, poor dietary choices and obesity can cause diabetes, heart attack, stroke, and kidney damage, among others.    ALCOHOL AND SMOKING:  Women should limit their alcohol intake to no more than 7 drinks/beers/glasses of wine (combined, not each!) per week. Moderation of alcohol intake to this level decreases your risk of breast cancer and liver damage. And of course, no recreational drugs are part of a healthy lifestyle.  And absolutely no smoking or even second hand smoke. Most people know smoking can cause heart and lung diseases, but did you know it also contributes to weakening of your bones? Aging of your skin?  Yellowing of your teeth and nails?  CALCIUM AND VITAMIN D:  Adequate intake of calcium and Vitamin D are recommended.  The recommendations for exact amounts of these supplements seem to change often, but generally speaking 600 mg of calcium (either carbonate or citrate) and 800 units of Vitamin D per day seems prudent. Certain women may benefit from higher intake of Vitamin D.  If you are among these women, your doctor will have told you during your visit.    PAP SMEARS:  Pap smears, to check for cervical cancer or precancers,  have traditionally been done yearly, although recent scientific advances have shown that most women can have pap smears less often.  However, every woman still should have a physical exam from her gynecologist every year. It will include a breast check, inspection of the vulva and vagina to check for abnormal growths or skin changes, a visual exam of the cervix, and then an exam to evaluate the size and shape of the uterus and  ovaries.  And after 55 years of age, a rectal exam is indicated to check for rectal cancers. We will also provide age appropriate advice regarding health maintenance, like when you should have certain vaccines, screening for sexually transmitted diseases, bone density testing, colonoscopy, mammograms, etc.   MAMMOGRAMS:  All women over 40 years old should have a yearly mammogram. Many facilities now offer a "3D" mammogram, which may cost around $50 extra out of pocket. If possible,  we recommend you accept the option to have the 3D mammogram performed.  It both reduces the number of women who will be called back for extra views which then turn out to be normal, and it is better than the routine mammogram at detecting truly abnormal areas.    COLONOSCOPY:  Colonoscopy to screen for colon cancer is recommended for all women at age 50.  We know, you hate the idea of the prep.  We agree, BUT, having colon cancer and not knowing it is worse!!  Colon cancer so often starts as a polyp that can be seen and removed at colonscopy, which can quite literally save your life!  And if your first colonoscopy is normal and you have no family history of colon cancer, most women don't have to have it again for 10 years.  Once every ten years, you can do something that may end up saving your life, right?  We will be happy to help you get it scheduled when you are ready.    Be sure to check your insurance coverage so you understand how much it will cost.  It may be covered as a preventative service at no cost, but you should check your particular policy.     About Cystocele  Overview  The pelvic organs, including the bladder, are normally supported by pelvic floor muscles and ligaments.  When these muscles and ligaments are stretched, weakened or torn, the wall between the bladder and the vagina sags or herniates causing a prolapse, sometimes called a cystocele.  This condition may cause discomfort and problems with emptying  the bladder.  It can be present in various stages.  Some people are not aware of the changes.  Others may notice changes at the vaginal opening or a feeling of the bladder dropping outside the body.  Causes of a Cystocele  A cystocele is usually caused by muscle straining or stretching during childbirth.  In addition, cystocele is more common after menopause, because the hormone estrogen helps keep the elastic tissues around the pelvic organs strong.  A cystocele is more likely to occur when levels of estrogen decrease.  Other causes include: heavy lifting, chronic coughing, previous pelvic surgery and obesity.  Symptoms  A bladder that has dropped from its normal position may cause: unwanted urine leakage (stress incontinence), frequent urination or urge to urinate, incomplete emptying of the bladder (not feeling bladder relief after emptying), pain or discomfort in the vagina, pelvis, groin, lower back or lower abdomen and frequent urinary tract infections.  Mild cases may not cause any symptoms.  Treatment Options  Pelvic floor (Kegel) exercises:  Strength training the muscles in your genital area  Behavioral changes: Treating and preventing constipation, taking time to empty your bladder properly, learning to lift properly and/or avoid heavy lifting when possible, stopping smoking, avoiding weight gain and treating a chronic cough or bronchitis.  A pessary: A vaginal support device is sometimes used to help pelvic support caused by muscle and ligament changes.  Surgery: Surgical repair may be necessary if symptoms cannot be managed with exercise, behavioral changes and a pessary.  Surgery is usually considered for severe cases.   2007, [employer] 

## 2015-09-19 NOTE — Progress Notes (Signed)
55 y.o. [id number] DivorcedCaucasianF here for annual exam.  She c/o a 1 year h/o "my uterus falling out". She notices just about every day, discomfort with a bulge. No bleeding, no pain. No difficulty with BM. Intermittently has urgency and frequency to void, not all the time. Can void small amounts. Feels she empties well. Symptoms for months.  Not sexually active.     No LMP recorded. Patient is postmenopausal.          Sexually active: No.  The current method of family planning is tubal ligation.    Exercising: No.  The patient does not participate in regular exercise at present. Smoker:  yes  Health Maintenance: Pap:  2007  History of abnormal Pap:  no MMG:  2007 WNL per pt  Colonoscopy:  2008, maybe sooner BMD:   no TDaP:  unsure Gardasil: no   reports that she has been smoking.  She has never used smokeless tobacco. She reports that she drinks about 0.6 oz of alcohol per week. She reports that she does not use illicit drugs.She is trying to quit smoking, smokes about 4-5 cigarettes a day. She has 101 kids, 69 year old twins (boy and girl) and a 55 year old. Oldest is out of the house.   Past Medical History  Diagnosis Date  . Migraines   . Insomnia   . Thyroid disease     Past Surgical History  Procedure Laterality Date  . Cholecystectomy    . Tubal ligation    . Wrist surgery Left     Pins & plate    Current Outpatient Prescriptions  Medication Sig Dispense Refill  . methimazole (TAPAZOLE) 10 MG tablet Take by mouth.    . propranolol (INDERAL) 20 MG tablet Take by mouth.    . Pseudoeph-Doxylamine-DM-APAP (NYQUIL PO) Take by mouth as needed.    . varenicline (CHANTIX CONTINUING MONTH PAK) 1 MG tablet Take 1 tablet (1 mg total) by mouth 2 (two) times daily. 60 tablet 0  . Melatonin (MELATONIN MAXIMUM STRENGTH) 5 MG TABS Take by mouth.     No current facility-administered medications for this visit.    Family History  Problem Relation Age of Onset  . Hyperlipidemia  Mother   . Cancer Father     prostate  . Mental illness Father   . Colon cancer Maternal Grandmother     Review of Systems  Constitutional: Negative.   HENT: Negative.   Eyes: Negative.   Respiratory: Negative.   Cardiovascular: Negative.   Gastrointestinal: Negative.   Endocrine: Negative.   Genitourinary:       Vaginal pressure.   Musculoskeletal: Negative.   Skin: Negative.   Allergic/Immunologic: Negative.   Neurological: Negative.   Hematological: Negative.   Psychiatric/Behavioral: Negative.     Exam:   BP 120/72 mmHg  Pulse 72  Resp 16  Ht 5' 3.5" (1.613 m)  Wt 158 lb 12.8 oz (72.031 kg)  BMI 27.69 kg/m2  Weight change: @ Height:   Height: 5' 3.5" (161.3 cm)  Ht Readings from Last 3 Encounters:  09/19/15 5' 3.5" (1.613 m)  01/25/15  (1.626 m)  12/22/14  (1.626 m)    General appearance: alert, cooperative and appears stated age Head: Normocephalic, without obvious abnormality, atraumatic Neck: no adenopathy, supple, symmetrical, trachea midline and thyroid normal to inspection and palpation Lungs: clear to auscultation bilaterally Breasts: normal appearance, no masses or tenderness Heart: regular rate and rhythm Abdomen: soft, non-tender; bowel sounds normal; no masses,  no organomegaly Extremities: extremities normal, atraumatic, no cyanosis or edema Skin: Skin color, texture, turgor normal. She has a patchy, erythematous rash bilateral inguinal folds (on questioning she c/o intermittent pruritus and irritation) Lymph nodes: Cervical, supraclavicular, and axillary nodes normal. No abnormal inguinal nodes palpated Neurologic: Grossly normal   Pelvic: External genitalia:  no lesions              Urethra:  normal appearing urethra with no masses, tenderness or lesions              Bartholins and Skenes: normal                 Vagina: mild vaginal atrophy, she has a grade 2-3 cystocele with valsalva and standing. No significant uterine  prolapse or rectocele              Cervix: no lesions               Bimanual Exam:  Uterus:  normal size, contour, position, consistency, mobility, non-tender              Adnexa: no mass, fullness, tenderness               Rectovaginal: Confirms               Anus:  normal sphincter tone, no lesions  Chaperone was present for exam.  A:  Well Woman with normal exam  Genital prolapse  Hyperthyroid  Intermittent urinary urgency and frequency  Cystocele  Candida intertrigo  P:   Pap with hpv  She will do her labs with primary or endocrinology  Mammogram and colonoscopy  Discussed breast self exam  Discussed calcium and vit D intake  Urine for ua, c&s  Discussed options: do nothing, pessary or surgery. Discussed the 30% recurrence rate with cystocele repair.   She will return for a pessary fitting. We discussed the possibility of needing vaginal estrogen     In addition to the annual exam over 10 minutes was spent counseling the patient on prolapse

## 2015-09-20 LAB — URINALYSIS, MICROSCOPIC ONLY
Bacteria, UA: NONE SEEN [HPF]
CASTS: NONE SEEN [LPF]
Crystals: NONE SEEN [HPF]
RBC / HPF: NONE SEEN RBC/HPF (ref <=2)
Yeast: NONE SEEN [HPF]

## 2015-09-21 LAB — URINE CULTURE

## 2015-09-21 LAB — IPS PAP TEST WITH HPV

## 2015-10-01 ENCOUNTER — Ambulatory Visit (INDEPENDENT_AMBULATORY_CARE_PROVIDER_SITE_OTHER): Payer: Commercial Managed Care - HMO | Admitting: Obstetrics and Gynecology

## 2015-10-01 ENCOUNTER — Encounter: Payer: Self-pay | Admitting: Obstetrics and Gynecology

## 2015-10-01 VITALS — BP 112/82 | HR 76 | Resp 16 | Wt 156.0 lb

## 2015-10-01 DIAGNOSIS — N8110 Cystocele, unspecified: Secondary | ICD-10-CM

## 2015-10-01 DIAGNOSIS — Z4689 Encounter for fitting and adjustment of other specified devices: Secondary | ICD-10-CM | POA: Diagnosis not present

## 2015-10-01 DIAGNOSIS — IMO0002 Reserved for concepts with insufficient information to code with codable children: Secondary | ICD-10-CM

## 2015-10-01 NOTE — Progress Notes (Signed)
Patient ID: Sonya Rodriguez, female   DOB: 10/19/1960, 55 y.o.   MRN: [id number] GYNECOLOGY  VISIT   HPI: 55 y.o.   Divorced  Caucasian  female   [id number] with No LMP recorded. Patient is postmenopausal.   here for pessary fitting for a symptomatic cystocele   GYNECOLOGIC HISTORY: No LMP recorded. Patient is postmenopausal. Contraception:postmenopause  Menopausal hormone therapy: none        OB History    Gravida Para Term Preterm AB TAB SAB Ectopic Multiple Living   2 2 1 1     1 3          Patient Active Problem List   Diagnosis Date Noted  . Migraine with aura   . Hyperthyroidism 11/20/2014  . Tobacco abuse 11/20/2014  . Migraines 11/20/2014    Past Medical History  Diagnosis Date  . Migraine with aura   . Insomnia   . Thyroid disease     Hyperthyroid    Past Surgical History  Procedure Laterality Date  . Cholecystectomy    . Tubal ligation    . Wrist surgery Left     Pins & plate    Current Outpatient Prescriptions  Medication Sig Dispense Refill  . Melatonin (MELATONIN MAXIMUM STRENGTH) 5 MG TABS Take by mouth.    . methimazole (TAPAZOLE) 10 MG tablet Take by mouth.    . nystatin cream (MYCOSTATIN) Apply 1 application topically 2 (two) times daily. Apply to affected area BID for up to 7 days. 30 g 0  . propranolol (INDERAL) 20 MG tablet Take by mouth.    . varenicline (CHANTIX CONTINUING MONTH PAK) 1 MG tablet Take 1 tablet (1 mg total) by mouth 2 (two) times daily. 60 tablet 0   No current facility-administered medications for this visit.     ALLERGIES: Review of patient's allergies indicates no known allergies.  Family History  Problem Relation Age of Onset  . Hyperlipidemia Mother   . Cancer Father     prostate  . Mental illness Father   . Colon cancer Maternal Grandmother     Social History   Social History  . Marital Status: Divorced    Spouse Name: N/A  . Number of Children: N/A  . Years of Education: N/A   Occupational History  .  Not on file.   Social History Main Topics  . Smoking status: Current Every Day Smoker -- 0.50 packs/day  . Smokeless tobacco: Never Used  . Alcohol Use: 0.6 oz/week    1 Standard drinks or equivalent per week     Comment: social  . Drug Use: No  . Sexual Activity: Not Currently    Birth Control/ Protection: Condom   Other Topics Concern  . Not on file   Social History Narrative   Divorced.   3 children.   Works as a Sales promotion account executiverelationship manager.   Highest level of education: College.   Enjoys Spending time with her children, yard work.    Review of Systems  Constitutional: Negative.   HENT: Negative.   Eyes: Negative.   Respiratory: Negative.   Cardiovascular: Negative.   Gastrointestinal: Negative.   Genitourinary: Negative.   Musculoskeletal: Negative.   Skin: Negative.   Neurological: Negative.   Endo/Heme/Allergies: Negative.   Psychiatric/Behavioral: Negative.     PHYSICAL EXAMINATION:    BP 112/82 mmHg  Pulse 76  Resp 16  Wt 156 lb (70.761 kg)    General appearance: alert, cooperative and appears stated age  Pelvic: External genitalia:  no lesions              Fitted with a #5 ring pessary with support, felt slightly small when the patient was standing. Fitted with a #6 ring pessary with support, comfortable in the office with sitting, standing, walking  and coughing  Chaperone was present for exam.  ASSESSMENT Cystocele    PLAN Fitted with a #6 ring pessary with support   An After Visit Summary was printed and given to the patient.

## 2015-10-08 ENCOUNTER — Encounter: Payer: Self-pay | Admitting: Obstetrics and Gynecology

## 2015-10-08 ENCOUNTER — Ambulatory Visit (INDEPENDENT_AMBULATORY_CARE_PROVIDER_SITE_OTHER): Payer: Commercial Managed Care - HMO | Admitting: Obstetrics and Gynecology

## 2015-10-08 VITALS — BP 110/70 | HR 80 | Resp 15 | Wt 154.0 lb

## 2015-10-08 DIAGNOSIS — IMO0002 Reserved for concepts with insufficient information to code with codable children: Secondary | ICD-10-CM

## 2015-10-08 DIAGNOSIS — R350 Frequency of micturition: Secondary | ICD-10-CM

## 2015-10-08 DIAGNOSIS — R109 Unspecified abdominal pain: Secondary | ICD-10-CM

## 2015-10-08 DIAGNOSIS — R300 Dysuria: Secondary | ICD-10-CM

## 2015-10-08 DIAGNOSIS — N8110 Cystocele, unspecified: Secondary | ICD-10-CM

## 2015-10-08 LAB — POCT URINALYSIS DIPSTICK
Bilirubin, UA: NEGATIVE
Glucose, UA: NEGATIVE
KETONES UA: NEGATIVE
Urobilinogen, UA: NEGATIVE
pH, UA: 6

## 2015-10-08 MED ORDER — PHENAZOPYRIDINE HCL 200 MG PO TABS: ORAL_TABLET | ORAL | Status: DC

## 2015-10-08 MED ORDER — SULFAMETHOXAZOLE-TRIMETHOPRIM 800-160 MG PO TABS: 1.0000 | ORAL_TABLET | Freq: Two times a day (BID) | ORAL | Status: DC

## 2015-10-08 NOTE — Progress Notes (Signed)
Patient ID: Sonya Rodriguez, female   DOB: 06-05-1960, 55 y.o.   MRN: [id number] GYNECOLOGY  VISIT   HPI: 55 y.o.   Divorced  Caucasian  female   [id number] with No LMP recorded. Patient is postmenopausal.   here for to follow up on her pessary. She was fitted with a #6 ring pessary with support last week. She felt crampy since pessary insertion. She took it out on Saturday, didn't feel better after she took it out. She is also c/o dysuria and urinary frequency x 1 day. Urgency to urinate. She feels aches in her lower to mid back. No fevers.  She did have a few episodes of stress incontinence with the pessary in, she also coughed and the pessary came out x 1.      GYNECOLOGIC HISTORY: No LMP recorded. Patient is postmenopausal. Contraception:post menopause  Menopausal hormone therapy: none         OB History    Gravida Para Term Preterm AB TAB SAB Ectopic Multiple Living   Patient Active Problem List   Diagnosis Date Noted  . Cystocele   . Migraine with aura   . Hyperthyroidism 11/20/2014  . Tobacco abuse 11/20/2014  . Migraines 11/20/2014    Past Medical History  Diagnosis Date  . Migraine with aura   . Insomnia   . Thyroid disease     Hyperthyroid  . Cystocele 5/17    fitted with a #6 ring pessary with support in 6/17    Past Surgical History  Procedure Laterality Date  . Cholecystectomy    . Tubal ligation    . Wrist surgery Left     Pins & plate    Current Outpatient Prescriptions  Medication Sig Dispense Refill  . Melatonin (MELATONIN MAXIMUM STRENGTH) 5 MG TABS Take by mouth.    . methimazole (TAPAZOLE) 10 MG tablet Take by mouth.    . nystatin cream (MYCOSTATIN) Apply 1 application topically 2 (two) times daily. Apply to affected area BID for up to 7 days. 30 g 0  . propranolol (INDERAL) 20 MG tablet Take by mouth.    . varenicline (CHANTIX CONTINUING MONTH PAK) 1 MG tablet Take 1 tablet (1 mg total) by mouth 2 (two) times daily. 60  tablet 0  . phenazopyridine (PYRIDIUM) 200 MG tablet One tab po TID 6 tablet 0  . sulfamethoxazole-trimethoprim (BACTRIM DS) 800-160 MG tablet Take 1 tablet by mouth 2 (two) times daily. One PO BID x 3 days 6 tablet 0   No current facility-administered medications for this visit.     ALLERGIES: Review of patient's allergies indicates no known allergies.  Family History  Problem Relation Age of Onset  . Hyperlipidemia Mother   . Cancer Father     prostate  . Mental illness Father   . Colon cancer Maternal Grandmother     Social History   Social History  . Marital Status: Divorced    Spouse Name: N/A  . Number of Children: N/A  . Years of Education: N/A   Occupational History  . Not on file.   Social History Main Topics  . Smoking status: Current Every Day Smoker -- 0.50 packs/day  . Smokeless tobacco: Never Used  . Alcohol Use: 0.6 oz/week    1 Standard drinks or equivalent per week     Comment: social  . Drug Use: No  . Sexual Activity: Not  Currently    Birth Control/ Protection: Condom   Other Topics Concern  . Not on file   Social History Narrative   Divorced.   3 children.   Works as a Sales promotion account executiverelationship manager.   Highest level of education: College.   Enjoys Spending time with her children, yard work.    Review of Systems  Constitutional: Negative.   HENT: Negative.   Eyes: Negative.   Respiratory: Negative.   Cardiovascular: Negative.   Gastrointestinal: Negative.   Genitourinary: Positive for dysuria and frequency.       Loss of urine with cough/sneeze   Musculoskeletal: Negative.   Skin: Negative.   Neurological: Negative.   Endo/Heme/Allergies: Negative.   Psychiatric/Behavioral: Negative.     PHYSICAL EXAMINATION:    BP 110/70 mmHg  Pulse 80  Resp 15  Wt 154 lb (69.854 kg)    General appearance: alert, cooperative and appears stated age Abdomen: soft, mildly tender in the suprapubic region; no masses,  no organomegaly  Pelvic: External  genitalia:  no lesions              Urethra:  normal appearing urethra with no masses, tenderness or lesions              Bartholins and Skenes: normal                 Vagina: normal appearing vagina with normal color and discharge, no lesions              Cervix: no lesions              Bimanual Exam:  Uterus:  normal size, contour, position, consistency, mobility, non-tender              Adnexa: no mass, fullness, tenderness               Bladder: tender to palpation  Chaperone was present for exam.  Urine dip: ++WBC, +++RBC, +nitrate, +protein  ASSESSMENT Cystitis Cystocele, h/o grade 2-3 cystocele, no other prolapse. She didn't like the pessary and doesn't want to try another one. Given that she coughed and it came out a smaller size would not likely work. If she wanted a pessary she would have to try a different type. Not interested GSI with the pessary in. Discussed need for urodynamics if she desires cystocele repair, would likely need TVT at the same time    PLAN Will treat cystitis, call with fevers, flank pain or any other concerns Pyridium for discomfort Call if not improved in 48 hours She will let me know if she desires cystocele repair   An After Visit Summary was printed and given to the patient.

## 2015-10-08 NOTE — Patient Instructions (Signed)

## 2015-10-09 LAB — URINE CULTURE

## 2015-10-09 LAB — URINALYSIS, MICROSCOPIC ONLY
CRYSTALS: NONE SEEN [HPF]
Casts: NONE SEEN [LPF]
YEAST: NONE SEEN [HPF]

## 2015-10-11 ENCOUNTER — Telehealth: Payer: Self-pay | Admitting: *Deleted

## 2015-10-11 NOTE — Telephone Encounter (Signed)
-----   Message from Romualdo BolkJill Evelyn Jertson, MD sent at 10/10/2015  8:35 AM EDT ----- Please inform the patient that her urine came back with multiple bacteria suggesting contamination, no clear infection. See how she is feeling. She should stop antibiotics. If still symptomatic she should do another clean catch urine for culture.

## 2015-10-11 NOTE — Telephone Encounter (Signed)
Left message to call in regards to results-eh 

## 2015-10-15 NOTE — Telephone Encounter (Signed)
Left another message to call -eh

## 2015-10-17 ENCOUNTER — Ambulatory Visit: Payer: Commercial Managed Care - HMO

## 2015-10-17 ENCOUNTER — Other Ambulatory Visit: Payer: Commercial Managed Care - HMO

## 2015-10-17 NOTE — Telephone Encounter (Signed)
Left another message for patient. She has not returned call- please advise

## 2015-10-17 NOTE — Telephone Encounter (Signed)
This is Dr. Edward JollySilva reviewing Dr. Salli QuarryJertson's in box.  She has already finished the abx course, so I am just closing the encounter.

## 2015-10-18 ENCOUNTER — Other Ambulatory Visit: Payer: Commercial Managed Care - HMO

## 2015-11-23 ENCOUNTER — Telehealth: Payer: Self-pay

## 2015-11-23 NOTE — Telephone Encounter (Signed)
Noted  

## 2015-11-23 NOTE — Telephone Encounter (Signed)
Pt left v/m; pts company gives pt free nicoderm CQ if prescribed by PCP. Pt is aware is OTC but only gets free with rx from PCP. Pt wants Sonya Reel NP to know that a prescription request will come from online pharmacy. FYI to Sonya Reel NP.

## 2015-11-28 ENCOUNTER — Telehealth: Payer: Self-pay | Admitting: Primary Care

## 2015-11-28 MED ORDER — NICOTINE 21 MG/24HR TD PT24: 21.0000 mg | MEDICATED_PATCH | Freq: Every day | TRANSDERMAL | 0 refills | Status: DC

## 2015-11-28 NOTE — Telephone Encounter (Signed)
Received faxed refill request for Nicoderm CQ. This have not been prescribed by Jae DireKate. Asked Jae DireKate if this okay to prescribed and Jae DireKate stated okay to send to pharmacy.   Sent prescription as requested.

## 2015-12-05 NOTE — Telephone Encounter (Signed)
Pt left v/m requesting cb about status of Nicoderm refill.Please advise.

## 2015-12-06 NOTE — Telephone Encounter (Signed)
notified patient that received a fax requesting Prior auth. Patient did not understanding why so she is going to call her place of work.

## 2016-01-15 ENCOUNTER — Other Ambulatory Visit: Payer: Self-pay | Admitting: Primary Care

## 2016-01-15 DIAGNOSIS — Z720 Tobacco use: Secondary | ICD-10-CM

## 2016-01-15 NOTE — Telephone Encounter (Signed)
Per DPR, left detail message for patient to call back.  

## 2016-01-15 NOTE — Telephone Encounter (Signed)
Ok to refill? Electronically refill request for   varenicline (CHANTIX CONTINUING MONTH PAK) 1 MG tablet  Last prescribed on 08/20/2015. Last seen on 01/25/2016.

## 2016-03-21 ENCOUNTER — Ambulatory Visit (INDEPENDENT_AMBULATORY_CARE_PROVIDER_SITE_OTHER): Payer: Commercial Managed Care - HMO | Admitting: Primary Care

## 2016-03-21 ENCOUNTER — Encounter: Payer: Self-pay | Admitting: Primary Care

## 2016-03-21 ENCOUNTER — Ambulatory Visit (INDEPENDENT_AMBULATORY_CARE_PROVIDER_SITE_OTHER)
Admission: RE | Admit: 2016-03-21 | Discharge: 2016-03-21 | Disposition: A | Discharge status: Home / Self Care | Payer: Commercial Managed Care - HMO | Source: Ambulatory Visit | Attending: Primary Care | Admitting: Primary Care

## 2016-03-21 VITALS — BP 116/80 | HR 98 | Temp 97.5°F | Ht 64.0 in | Wt 159.8 lb

## 2016-03-21 DIAGNOSIS — R05 Cough: Secondary | ICD-10-CM | POA: Diagnosis not present

## 2016-03-21 DIAGNOSIS — Z720 Tobacco use: Secondary | ICD-10-CM

## 2016-03-21 DIAGNOSIS — J449 Chronic obstructive pulmonary disease, unspecified: Secondary | ICD-10-CM | POA: Insufficient documentation

## 2016-03-21 DIAGNOSIS — R059 Cough, unspecified: Secondary | ICD-10-CM

## 2016-03-21 MED ORDER — BUDESONIDE-FORMOTEROL FUMARATE 160-4.5 MCG/ACT IN AERO: 2.0000 | INHALATION_SPRAY | Freq: Two times a day (BID) | RESPIRATORY_TRACT | 2 refills | Status: DC

## 2016-03-21 NOTE — Assessment & Plan Note (Signed)
Quit smoking 2 months ago. Commended her on this success.

## 2016-03-21 NOTE — Progress Notes (Signed)
Subjective:    Patient ID: Sonya Rodriguez, female    DOB: 05/13/1960, 55 y.o.   MRN: [id number]  HPI  Sonya Rodriguez is a 55 year old female who presents today to discuss smoking concerns. She quit smoking 2 months and has felt "terrible" since. She had smoked cigarettes for the past 30+ years intermittently.   She's experiencing symptoms of shortness of breath, headaches and cough. She will experience shortness of breath especially with moderate exertion as she will have to stop and rest.  She was seen at a walk in clinic 3 weeks ago and was treated for acute bronchitis with albuterol inhaler, cough medication, and antibiotics. She didn't feel as though she needed the antibiotics and didn't feel any better. The albuterol helps temporarily, but is using it 5-6 times daily. She had a chest xray in 2013 which revealed COPD.   She denies fevers, chills, unexplained weight loss, hemoptysis.  Review of Systems  Constitutional: Negative for fever.  HENT: Negative for congestion.   Respiratory: Positive for cough, shortness of breath and wheezing. Negative for chest tightness.   Cardiovascular: Negative for chest pain.  Neurological: Positive for headaches.       Past Medical History:  Diagnosis Date  . Cystocele 5/17   fitted with a #6 ring pessary with support in 6/17  . Insomnia   . Migraine with aura   . Thyroid disease    Hyperthyroid     Social History   Social History  . Marital status: Divorced    Spouse name: N/A  . Number of children: N/A  . Years of education: N/A   Occupational History  . Not on file.   Social History Main Topics  . Smoking status: Former Smoker    Packs/day: 0.50  . Smokeless tobacco: Never Used  . Alcohol use 0.6 oz/week    1 Standard drinks or equivalent per week     Comment: social  . Drug use: No  . Sexual activity: Not Currently    Birth control/ protection: Condom   Other Topics Concern  . Not on file   Social History Narrative     Divorced.   3 children.   Works as a Sales promotion account executiverelationship manager.   Highest level of education: College.   Enjoys Spending time with her children, yard work.    Past Surgical History:  Procedure Laterality Date  . CHOLECYSTECTOMY    . TUBAL LIGATION    . WRIST SURGERY Left    Pins & plate    Family History  Problem Relation Age of Onset  . Hyperlipidemia Mother   . Cancer Father     prostate  . Mental illness Father   . Colon cancer Maternal Grandmother     No Known Allergies  Current Outpatient Prescriptions on File Prior to Visit  Medication Sig Dispense Refill  . Melatonin (MELATONIN MAXIMUM STRENGTH) 5 MG TABS Take by mouth.    . methimazole (TAPAZOLE) 10 MG tablet Take by mouth.    . nystatin cream (MYCOSTATIN) Apply 1 application topically 2 (two) times daily. Apply to affected area BID for up to 7 days. (Patient not taking: Reported on 03/21/2016) 30 g 0  . propranolol (INDERAL) 20 MG tablet Take by mouth.     No current facility-administered medications on file prior to visit.     BP 116/80   Pulse 98   Temp 97.5 F (36.4 C) (Oral)   Ht 5\' 4"  (1.626 m)   Wt 159  lb 12.8 oz (72.5 kg)   SpO2 97%   BMI 27.43 kg/m    Objective:   Physical Exam  Constitutional: She appears well-nourished. She does not appear ill.  HENT:  Right Ear: Tympanic membrane and ear canal normal.  Left Ear: Tympanic membrane and ear canal normal.  Nose: Right sinus exhibits no maxillary sinus tenderness and no frontal sinus tenderness. Left sinus exhibits no maxillary sinus tenderness and no frontal sinus tenderness.  Mouth/Throat: Oropharynx is clear and moist.  Eyes: Conjunctivae are normal.  Neck: Neck supple.  Cardiovascular: Normal rate and regular rhythm.   Pulmonary/Chest: Effort normal. She has wheezes in the right upper field and the left upper field. She has rhonchi in the right upper field, the right lower field, the left upper field and the left lower field. She has no rales.   Lymphadenopathy:    She has no cervical adenopathy.  Skin: Skin is warm and dry.          Assessment & Plan:

## 2016-03-21 NOTE — Progress Notes (Signed)
Pre visit review using our clinic review tool, if applicable. No additional management support is needed unless otherwise documented below in the visit note. 

## 2016-03-21 NOTE — Patient Instructions (Addendum)
Start budesonide-formoterol (Symbicort) inhaler. Inhale 2 puffs into the lungs twice daily, everyday.  Use the albuterol inhaler only as needed for breakthrough shortness of breath and wheezing.  Complete xray(s) prior to leaving today. I will notify you of your results once received.  Please call me if the inhaler is too expensive or if no improvement in 2 weeks.  It was a pleasure to see you today!

## 2016-03-21 NOTE — Assessment & Plan Note (Signed)
Dry, persistent cough x several months since she stopped smoking. History of smoker for 30+ years. Exam today with mild wheezing and rhonchi. Does not appear acutely ill. Repeat chest xray today to rule out any nodules or changes from prior xray in 2013. Start Symbicort inhaler. Discussed this is to be used daily. Use albuterol inhaler sparingly. She will call if no improvement.

## 2016-03-25 ENCOUNTER — Telehealth: Payer: Self-pay

## 2016-03-25 NOTE — Telephone Encounter (Signed)
Pt left v/m; pt seen 03/21/16 and symbicort is too expensive; pt request names of meds that pt can look up on ins website to see if more cost affordable but pt needs name of potential meds that could be substituted for symbicort. Pt request cb.

## 2016-03-25 NOTE — Telephone Encounter (Signed)
Spoken and notified patient of Kate's comments. Patient verbalized understanding. 

## 2016-03-25 NOTE — Telephone Encounter (Signed)
Noted. The best options include Advair and Breo Ellipta.  Also have her check on Flovent, Pulmicort, Spiriva.

## 2016-03-25 NOTE — Telephone Encounter (Signed)
Error - duplicate

## 2016-04-25 ENCOUNTER — Telehealth: Payer: Self-pay

## 2016-04-25 DIAGNOSIS — J449 Chronic obstructive pulmonary disease, unspecified: Secondary | ICD-10-CM

## 2016-04-25 MED ORDER — ALBUTEROL SULFATE HFA 108 (90 BASE) MCG/ACT IN AERS: 2.0000 | INHALATION_SPRAY | Freq: Four times a day (QID) | RESPIRATORY_TRACT | 2 refills | Status: DC | PRN

## 2016-04-25 MED ORDER — BUDESONIDE-FORMOTEROL FUMARATE 160-4.5 MCG/ACT IN AERO: 2.0000 | INHALATION_SPRAY | Freq: Two times a day (BID) | RESPIRATORY_TRACT | 5 refills | Status: DC

## 2016-04-25 NOTE — Telephone Encounter (Signed)
Spoken and notified patient of Kate's comments. Patient verbalized understanding.  Patient stated that yes, she did get the Symbicort.  Ok per Jae DireKate to refill the Symbicort.

## 2016-04-25 NOTE — Telephone Encounter (Signed)
Sent in [employer] inhaler to her pharmacy. Did she ever get the Symbicort?

## 2016-04-25 NOTE — Telephone Encounter (Signed)
Pt called requesting an rx for fast acting inhaler proair HFA 90mcg she said [facility] but you told her that you would refill it for her.

## 2016-05-09 ENCOUNTER — Telehealth: Payer: Self-pay | Admitting: *Deleted

## 2016-05-09 NOTE — Telephone Encounter (Signed)
Pt lm at triage indicating Sonya Rodriguez recently filled inhaler, but she accidentally threw it away. Her insurance will not cover her to have a new one until next month, and is wondering if Sonya Rodriguez will write a Rx for a new inhaler or change the dosing on the one she has so she is able to have it filled.

## 2016-05-09 NOTE — Telephone Encounter (Signed)
Please notify patient that unfortunately I cannot do that as it would be insurance fraud.  Do we have a coupon or can she use the Good Rx card for a discount? Can we call her insurance to see if there's anything that can be done?

## 2016-05-12 ENCOUNTER — Encounter: Payer: Self-pay | Admitting: Primary Care

## 2016-05-12 ENCOUNTER — Other Ambulatory Visit: Payer: Self-pay | Admitting: Primary Care

## 2016-05-12 DIAGNOSIS — J449 Chronic obstructive pulmonary disease, unspecified: Secondary | ICD-10-CM

## 2016-05-12 MED ORDER — ALBUTEROL SULFATE HFA 108 (90 BASE) MCG/ACT IN AERS: 2.0000 | INHALATION_SPRAY | Freq: Four times a day (QID) | RESPIRATORY_TRACT | 0 refills | Status: DC | PRN

## 2016-05-12 NOTE — Telephone Encounter (Signed)
Message left for patient to return my call.  

## 2016-05-12 NOTE — Telephone Encounter (Signed)
Spoken and notified patient of Kate's comments. Patient verbalized understanding. 

## 2016-05-13 ENCOUNTER — Other Ambulatory Visit: Payer: Self-pay | Admitting: Primary Care

## 2016-05-13 DIAGNOSIS — J449 Chronic obstructive pulmonary disease, unspecified: Secondary | ICD-10-CM

## 2016-05-13 MED ORDER — BUDESONIDE-FORMOTEROL FUMARATE 160-4.5 MCG/ACT IN AERO: 2.0000 | INHALATION_SPRAY | Freq: Two times a day (BID) | RESPIRATORY_TRACT | 1 refills | Status: DC

## 2016-05-14 ENCOUNTER — Other Ambulatory Visit: Payer: Self-pay

## 2016-05-14 DIAGNOSIS — J449 Chronic obstructive pulmonary disease, unspecified: Secondary | ICD-10-CM

## 2016-05-14 MED ORDER — ALBUTEROL SULFATE HFA 108 (90 BASE) MCG/ACT IN AERS: 2.0000 | INHALATION_SPRAY | Freq: Four times a day (QID) | RESPIRATORY_TRACT | 0 refills | Status: DC | PRN

## 2016-06-25 ENCOUNTER — Ambulatory Visit (INDEPENDENT_AMBULATORY_CARE_PROVIDER_SITE_OTHER)
Admission: RE | Admit: 2016-06-25 | Discharge: 2016-06-25 | Disposition: A | Discharge status: Home / Self Care | Payer: Commercial Managed Care - HMO | Source: Ambulatory Visit | Attending: Primary Care | Admitting: Primary Care

## 2016-06-25 ENCOUNTER — Encounter: Payer: Self-pay | Admitting: Primary Care

## 2016-06-25 ENCOUNTER — Ambulatory Visit (INDEPENDENT_AMBULATORY_CARE_PROVIDER_SITE_OTHER): Payer: Commercial Managed Care - HMO | Admitting: Primary Care

## 2016-06-25 VITALS — BP 122/82 | HR 60 | Temp 98.1°F | Ht 64.0 in | Wt 165.0 lb

## 2016-06-25 DIAGNOSIS — M79642 Pain in left hand: Secondary | ICD-10-CM

## 2016-06-25 DIAGNOSIS — M25532 Pain in left wrist: Secondary | ICD-10-CM

## 2016-06-25 MED ORDER — DICLOFENAC SODIUM 75 MG PO TBEC: 75.0000 mg | DELAYED_RELEASE_TABLET | Freq: Two times a day (BID) | ORAL | 0 refills | Status: DC | PRN

## 2016-06-25 NOTE — Progress Notes (Signed)
Pre visit review using our clinic review tool, if applicable. No additional management support is needed unless otherwise documented below in the visit note. 

## 2016-06-25 NOTE — Assessment & Plan Note (Signed)
Also to left wrist x 2 months. Exam today with tenderness and mild joint swelling. Good ROM. Good pulses. No s/s of infection. Sounds to be tendonitis vs arthritis. Given history of surgery, duration of symptoms will obtain xrays for further evaluation.  Rx for diclofenac sent to pharmacy, discussed to refrain from other NSAID's. Brace provided for support, discussed when to use. Consider PT or ortho referral if no improvement.

## 2016-06-25 NOTE — Progress Notes (Signed)
Subjective:    Patient ID: Sonya Rodriguez, female    DOB: 05-20-1960, 56 y.o.   MRN: [id number]  HPI  Sonya Rodriguez is a 56 year old female who presents today with a chief complaint of hand and wrist pain. Her pain is located to the left medial wrist, dorsal hand, and thumb. Her pain has been present for the past several months. She fractured her left wrist in 2012, she has pins and a plate in place. She has noticed swelling to her wrist with a "knot". She denies injury/trauma, redness. She does have numbness/tingling that will travel up to her mid arm, this is intermittent. She's taking 7-8 tablet of Ibuprofen throughout the day. The pain will wake her from sleep sometimes and will take 4-5 tablets of ibuprofen then.   Review of Systems  Musculoskeletal: Positive for arthralgias and joint swelling.  Skin: Negative for color change.  Neurological: Positive for numbness.       Past Medical History:  Diagnosis Date  . Cystocele 5/17   fitted with a #6 ring pessary with support in 6/17  . Insomnia   . Migraine with aura   . Thyroid disease    Hyperthyroid     Social History   Social History  . Marital status: Divorced    Spouse name: N/A  . Number of children: N/A  . Years of education: N/A   Occupational History  . Not on file.   Social History Main Topics  . Smoking status: Former Smoker    Packs/day: 0.50  . Smokeless tobacco: Never Used  . Alcohol use 0.6 oz/week    1 Standard drinks or equivalent per week     Comment: social  . Drug use: No  . Sexual activity: Not Currently    Birth control/ protection: Condom   Other Topics Concern  . Not on file   Social History Narrative   Divorced.   3 children.   Works as a Sales promotion account executive.   Highest level of education: College.   Enjoys Spending time with her children, yard work.    Past Surgical History:  Procedure Laterality Date  . CHOLECYSTECTOMY    . TUBAL LIGATION    . WRIST SURGERY Left    Pins &  plate    Family History  Problem Relation Age of Onset  . Hyperlipidemia Mother   . Cancer Father     prostate  . Mental illness Father   . Colon cancer Maternal Grandmother     No Known Allergies  Current Outpatient Prescriptions on File Prior to Visit  Medication Sig Dispense Refill  . albuterol (PROAIR HFA) 108 (90 Base) MCG/ACT inhaler Inhale 2 puffs into the lungs every 6 (six) hours as needed for wheezing or shortness of breath. 1 Inhaler 0  . budesonide-formoterol (SYMBICORT) 160-4.5 MCG/ACT inhaler Inhale 2 puffs into the lungs 2 (two) times daily. 3 Inhaler 1  . Melatonin (MELATONIN MAXIMUM STRENGTH) 5 MG TABS Take by mouth.    . methimazole (TAPAZOLE) 10 MG tablet Take by mouth.    . nystatin cream (MYCOSTATIN) Apply 1 application topically 2 (two) times daily. Apply to affected area BID for up to 7 days. 30 g 0  . propranolol (INDERAL) 20 MG tablet Take 20 mg by mouth daily.      No current facility-administered medications on file prior to visit.     BP 122/82   Pulse 60   Temp 98.1 F (36.7 C) (Oral)   Ht  5\' 4"  (1.626 m)   Wt 165 lb (74.8 kg)   SpO2 95%   BMI 28.32 kg/m    Objective:   Physical Exam  Constitutional: She appears well-nourished.  Pulmonary/Chest: Effort normal and breath sounds normal.  Musculoskeletal:       Left wrist: She exhibits tenderness and swelling. She exhibits normal range of motion, no bony tenderness, no crepitus and no deformity.       Left hand: She exhibits tenderness. She exhibits normal range of motion, no bony tenderness and normal capillary refill. Normal strength noted.       Hands: Skin: Skin is warm and dry. No erythema.          Assessment & Plan:

## 2016-06-25 NOTE — Patient Instructions (Signed)
Complete xray(s) prior to leaving today. I will notify you of your results once received.  Try diclofenac 75 mg tablets for pain and inflammation to the wrist and hand. Take 1 tablet by mouth twice daily as needed.   Do not take Ibuprofen or Naproxen when taking the diclofenac tablets.  Use the brace when sleeping and night and when active during the day.   Please notify me if no improvement in 1-2 weeks.  It was a pleasure to see you today!

## 2016-06-26 ENCOUNTER — Encounter: Payer: Self-pay | Admitting: Primary Care

## 2016-07-30 ENCOUNTER — Encounter: Payer: Self-pay | Admitting: Family Medicine

## 2016-07-30 ENCOUNTER — Ambulatory Visit (INDEPENDENT_AMBULATORY_CARE_PROVIDER_SITE_OTHER)
Admission: RE | Admit: 2016-07-30 | Discharge: 2016-07-30 | Disposition: A | Discharge status: Home / Self Care | Payer: Commercial Managed Care - HMO | Source: Ambulatory Visit | Attending: Family Medicine | Admitting: Family Medicine

## 2016-07-30 ENCOUNTER — Ambulatory Visit (INDEPENDENT_AMBULATORY_CARE_PROVIDER_SITE_OTHER): Payer: Commercial Managed Care - HMO | Admitting: Family Medicine

## 2016-07-30 VITALS — BP 110/64 | HR 68 | Temp 98.0°F | Ht 64.0 in | Wt 165.2 lb

## 2016-07-30 DIAGNOSIS — S52352S Displaced comminuted fracture of shaft of radius, left arm, sequela: Secondary | ICD-10-CM | POA: Diagnosis not present

## 2016-07-30 DIAGNOSIS — M25532 Pain in left wrist: Secondary | ICD-10-CM

## 2016-07-30 DIAGNOSIS — S92501A Displaced unspecified fracture of right lesser toe(s), initial encounter for closed fracture: Secondary | ICD-10-CM | POA: Diagnosis not present

## 2016-07-30 DIAGNOSIS — M79671 Pain in right foot: Secondary | ICD-10-CM | POA: Diagnosis not present

## 2016-07-30 NOTE — Progress Notes (Signed)
Pre visit review using our clinic review tool, if applicable. No additional management support is needed unless otherwise documented below in the visit note. 

## 2016-07-30 NOTE — Progress Notes (Signed)
Dr. Karleen Hampshire T. Calissa Swenor, MD, CAQ Sports Medicine Primary Care and Sports Medicine [address] [place], [address] Phone: [phone] Fax: [phone]  07/30/2016  Patient: Sonya Rodriguez, MRN: [id number], DOB: Feb 26, 1961, 56 y.o.  Primary Physician:  Morrie Sheldon, NP   Chief Complaint  Patient presents with  . Wrist Pain    Left x several months  . Foot Injury    Right-Smashed foot into piece of furniture on Monday   Subjective:   Sonya Rodriguez is a 56 y.o. very pleasant female patient who presents with the following:  2 primary questions.  1.  Left wrist pain ongoing for approximately 3 months or so.  History is very significant for a history of a comminuted, displaced, angulated distal radius fracture in March 2013.  This was fixed by operative fixation by Dr. Rosita Kea.  Patient now has pain in the true wrist and then around the distal radius region.  All of her plate and screws are still intact.  She has not had any subsequent trauma or injury.  She has tried placing this in a thumb spica splint as well as resting and using anti-inflammatories.  1. Right sided fifth toe fracture.  Extensive bruising throughout the entirety of the forefoot and pain throughout the entirety of the forefoot.  The patient reports that the toe was displaced laterally almost perpendicular, and she manipulated this herself at the time of injury.  Past Medical History, Surgical History, Social History, Family History, Problem List, Medications, and Allergies have been reviewed and updated if relevant.  Patient Active Problem List   Diagnosis Date Noted  . Pain of left hand 06/25/2016  . Chronic obstructive pulmonary disease (HCC) 03/21/2016  . Cystocele   . Migraine with aura   . Hyperthyroidism 11/20/2014  . Tobacco abuse 11/20/2014  . Migraines 11/20/2014    Past Medical History:  Diagnosis Date  . Cystocele 5/17   fitted with a #6 ring pessary with support in 6/17  .  Insomnia   . Migraine with aura   . Thyroid disease    Hyperthyroid    Past Surgical History:  Procedure Laterality Date  . CHOLECYSTECTOMY    . TUBAL LIGATION    . WRIST SURGERY Left    Pins & plate    Social History   Social History  . Marital status: Divorced    Spouse name: N/A  . Number of children: N/A  . Years of education: N/A   Occupational History  . Not on file.   Social History Main Topics  . Smoking status: Former Smoker    Packs/day: 0.50  . Smokeless tobacco: Never Used  . Alcohol use 0.6 oz/week    1 Standard drinks or equivalent per week     Comment: social  . Drug use: No  . Sexual activity: Not Currently    Birth control/ protection: Condom   Other Topics Concern  . Not on file   Social History Narrative   Divorced.   3 children.   Works as a Sales promotion account executive.   Highest level of education: College.   Enjoys Spending time with her children, yard work.    Family History  Problem Relation Age of Onset  . Hyperlipidemia Mother   . Cancer Father     prostate  . Mental illness Father   . Colon cancer Maternal Grandmother     No Known Allergies  Medication list reviewed and updated in full in [facility].  GEN: No fevers, chills. Nontoxic. Primarily MSK c/o today. MSK: Detailed in the HPI GI: tolerating PO intake without difficulty Neuro: No numbness, parasthesias, or tingling associated. Otherwise the pertinent positives of the ROS are noted above.   Objective:   BP 110/64   Pulse 68   Temp 98 F (36.7 C) (Oral)   Ht  (1.626 m)   Wt 165 lb 4 oz (75 kg)   BMI 28.37 kg/m    GEN: WDWN, NAD, Non-toxic, Alert & Oriented x 3 HEENT: Atraumatic, Normocephalic.  Ears and Nose: No external deformity. EXTR: No clubbing/cyanosis/edema NEURO: Normal gait.  PSYCH: Normally interactive. Conversant. Not depressed or anxious appearing.  Calm demeanor.    The entirety of the forefoot on the right side is ecchymotic.   Tenderness to palpation in the distal metatarsals 1 through 5.  The fourth and fifth toes are notably tender.  The entire midfoot and ankle are nontender.  Left wrist has diminished range of motion compared to the right.  Minimal to mild tenderness in the true wrist without effusion.  Finkelstein's test is negative.  Scaphoid is nontender.  Hook of hamate is nontender.  Nontender throughout all digits as well as all metacarpals.  No significant tenderness throughout the length of the radius or the ulna.  Radiology: Dg Foot Complete Right  Result Date: 07/30/2016 CLINICAL DATA:  Direct trauma to the right foot 3 days ago EXAM: RIGHT FOOT COMPLETE - 3+ VIEW COMPARISON:  None in PACs FINDINGS: The patient has sustained an acute mildly distracted and angulated fracture of the mid and distal shaft of the proximal phalanx of the fifth toe. The distal phalanx appears intact. The fifth metatarsal is intact. The first through fourth rays exhibit no acute fractures. The tarsal bones are intact. IMPRESSION: Acute mildly angulated and distracted fracture of the shaft of the proximal phalanx of the right fifth toe. Electronically Signed   By: David  Swaz[place]and M.D.   On: 07/30/2016 12:36     Assessment and Plan:   Closed displaced comminuted fracture of shaft of left radius, sequela  Right foot pain - Plan: DG Foot Complete Right  Closed fracture of phalanx of right fifth toe, initial encounter  Left wrist pain  My recommendations of continued intermittent rest and a splint, anti-inflammatories, Tylenol, and ice were declined by the patient.  My suspicion is that this is all post-traumatic pain given her extensive fracture history. My recommendation was for her to discuss this with the operating surgeon.  She preferred to make this appointment herself.  Right fifth digit fracture on the toe.  Recommended that she is if at all possible.  Anticipate this will take at least a month to heal.  She could also try  buddy taping which was discussed.  Follow-up: prn only with me.  Meds ordered this encounter  Medications  . cloNIDine HCl (KAPVAY) 0.1 MG TB12 ER tablet    Sig: Take 0.1 mg by mouth at bedtime.    Refill:  0  . DULoxetine (CYMBALTA) 30 MG capsule    Sig: TAKE ONE CAPSULE BY MOUTH AT BEDTIME FOR MOOD    Refill:  0  . HYDROcodone-acetaminophen (NORCO/VICODIN) 5-325 MG tablet    Sig: Take 1 tablet by mouth every 6 (six) hours as needed for moderate pain.   Orders Placed This Encounter  Procedures  . DG Foot Complete Right    Signed,  Markeith Jue T. Durward Matranga, MD   Allergies as of 07/30/2016   No Known  Allergies     Medication List       Accurate as of 07/30/16 11:59 PM. Always use your most recent med list.          albuterol 108 (90 Base) MCG/ACT inhaler Commonly known as:  PROAIR HFA Inhale 2 puffs into the lungs every 6 (six) hours as needed for wheezing or shortness of breath.   budesonide-formoterol 160-4.5 MCG/ACT inhaler Commonly known as:  SYMBICORT Inhale 2 puffs into the lungs 2 (two) times daily.   cloNIDine HCl 0.1 MG Tb12 ER tablet Commonly known as:  KAPVAY Take 0.1 mg by mouth at bedtime.   diclofenac 75 MG EC tablet Commonly known as:  VOLTAREN Take 1 tablet (75 mg total) by mouth 2 (two) times daily as needed for moderate pain.   DULoxetine 30 MG capsule Commonly known as:  CYMBALTA TAKE ONE CAPSULE BY MOUTH AT BEDTIME FOR MOOD   HYDROcodone-acetaminophen 5-325 MG tablet Commonly known as:  NORCO/VICODIN Take 1 tablet by mouth every 6 (six) hours as needed for moderate pain.   MELATONIN MAXIMUM STRENGTH 5 MG Tabs Generic drug:  Melatonin Take by mouth.   methimazole 10 MG tablet Commonly known as:  TAPAZOLE Take by mouth.   nystatin cream Commonly known as:  MYCOSTATIN Apply 1 application topically 2 (two) times daily. Apply to affected area BID for up to 7 days.   propranolol 20 MG tablet Commonly known as:  INDERAL Take 20 mg by  mouth daily.

## 2016-08-01 ENCOUNTER — Encounter: Payer: Self-pay | Admitting: Family Medicine

## 2016-08-08 ENCOUNTER — Telehealth: Payer: Self-pay

## 2016-08-08 NOTE — Telephone Encounter (Signed)
Pt left v/m pt seen 07/30/16 with fx toe and bruised foot. Foot swollen and cannot wear shoe; pt keeps hitting toe and pt request boot. Pt request cb.

## 2016-08-10 NOTE — Telephone Encounter (Signed)
Can you help the patient get a short cam walker boot. To be worn no more than 4 weeks.

## 2016-08-11 ENCOUNTER — Encounter: Payer: Self-pay | Admitting: Primary Care

## 2016-08-11 NOTE — Telephone Encounter (Signed)
Sonya Rodriguez came in and was fitted with a Medium Short Cam Walker.

## 2016-08-11 NOTE — Telephone Encounter (Signed)
Spoke with Sonya Rodriguez and advised her if she wanted to come by office today, I can get her fitted for a short cam walker boot.  She states she will see because her foot is actually feeling some better now.  She wrapped it really good over the weekend.  I advised if she decided she did want a boot , she could stop by anytime today except from 1 to 2 pm due to I would be in a meeting.

## 2016-08-19 DIAGNOSIS — J40 Bronchitis, not specified as acute or chronic: Secondary | ICD-10-CM

## 2016-08-19 HISTORY — DX: Bronchitis, not specified as acute or chronic: J40

## 2016-08-25 ENCOUNTER — Other Ambulatory Visit: Payer: Self-pay | Admitting: Primary Care

## 2016-08-25 DIAGNOSIS — J449 Chronic obstructive pulmonary disease, unspecified: Secondary | ICD-10-CM

## 2016-08-26 ENCOUNTER — Other Ambulatory Visit: Payer: Self-pay

## 2016-08-26 DIAGNOSIS — J449 Chronic obstructive pulmonary disease, unspecified: Secondary | ICD-10-CM

## 2016-08-26 NOTE — Telephone Encounter (Signed)
Pt did not pick up the proair inhaler that was left at [facility] because she was going to be charged the same price for 0ne inhaler as 3 inhalers. I explained that this was a rescue inhaler and instructions are as needed; not a daily med. Pt said her ins does not care about that all rx have to be written as 3 month rx and pt wants new rx sent to [facility] for 3 inhalers; I spoke with Angelica ChessmanMandy RN team lead and she advised to send note to Allayne GitelmanK Clark NP for approval. Pt voiced understanding and request cb when done. Pt last seen for COPD 03/21/16.Please advise.

## 2016-08-27 MED ORDER — ALBUTEROL SULFATE HFA 108 (90 BASE) MCG/ACT IN AERS: INHALATION_SPRAY | RESPIRATORY_TRACT | 2 refills | Status: DC

## 2016-09-01 ENCOUNTER — Encounter: Payer: Self-pay | Admitting: Primary Care

## 2016-09-03 ENCOUNTER — Other Ambulatory Visit: Payer: Self-pay | Admitting: Primary Care

## 2016-09-03 DIAGNOSIS — J449 Chronic obstructive pulmonary disease, unspecified: Secondary | ICD-10-CM

## 2016-09-04 ENCOUNTER — Other Ambulatory Visit: Payer: Self-pay | Admitting: Primary Care

## 2016-09-04 DIAGNOSIS — J449 Chronic obstructive pulmonary disease, unspecified: Secondary | ICD-10-CM

## 2016-09-04 MED ORDER — ALBUTEROL SULFATE HFA 108 (90 BASE) MCG/ACT IN AERS: INHALATION_SPRAY | RESPIRATORY_TRACT | 5 refills | Status: DC

## 2016-09-10 NOTE — Telephone Encounter (Signed)
See My chart message. Do you mind calling [facility] to see what they require from us?

## 2016-09-12 ENCOUNTER — Encounter: Payer: Self-pay | Admitting: Obstetrics and Gynecology

## 2016-09-29 ENCOUNTER — Encounter: Payer: Self-pay | Admitting: Emergency Medicine

## 2016-09-29 ENCOUNTER — Other Ambulatory Visit: Payer: Self-pay | Admitting: Orthopedic Surgery

## 2016-09-29 ENCOUNTER — Emergency Department
Admission: EM | Admit: 2016-09-29 | Discharge: 2016-09-29 | Disposition: A | Discharge status: Home / Self Care | Payer: 59 | Attending: Emergency Medicine | Admitting: Emergency Medicine

## 2016-09-29 DIAGNOSIS — Y939 Activity, unspecified: Secondary | ICD-10-CM | POA: Diagnosis not present

## 2016-09-29 DIAGNOSIS — Z87891 Personal history of nicotine dependence: Secondary | ICD-10-CM | POA: Insufficient documentation

## 2016-09-29 DIAGNOSIS — X509XXA Other and unspecified overexertion or strenuous movements or postures, initial encounter: Secondary | ICD-10-CM | POA: Diagnosis not present

## 2016-09-29 DIAGNOSIS — M79642 Pain in left hand: Secondary | ICD-10-CM

## 2016-09-29 DIAGNOSIS — Y999 Unspecified external cause status: Secondary | ICD-10-CM | POA: Insufficient documentation

## 2016-09-29 DIAGNOSIS — M70032 Crepitant synovitis (acute) (chronic), left wrist: Secondary | ICD-10-CM

## 2016-09-29 DIAGNOSIS — Z79899 Other long term (current) drug therapy: Secondary | ICD-10-CM | POA: Diagnosis not present

## 2016-09-29 DIAGNOSIS — M70842 Other soft tissue disorders related to use, overuse and pressure, left hand: Secondary | ICD-10-CM | POA: Diagnosis not present

## 2016-09-29 DIAGNOSIS — M779 Enthesopathy, unspecified: Secondary | ICD-10-CM

## 2016-09-29 DIAGNOSIS — S63602A Unspecified sprain of left thumb, initial encounter: Secondary | ICD-10-CM | POA: Diagnosis not present

## 2016-09-29 DIAGNOSIS — Y929 Unspecified place or not applicable: Secondary | ICD-10-CM | POA: Diagnosis not present

## 2016-09-29 DIAGNOSIS — M778 Other enthesopathies, not elsewhere classified: Secondary | ICD-10-CM

## 2016-09-29 NOTE — ED Notes (Addendum)
Pt out in hallway, stating that she wants to go back to "urgent clinic".  Pt sts that she does not want to wait for discharge instructions, sts "what paperwork...she basically told me I was making it up".  [facility]. Pt ambulatory, resp even and unlabored, able to talk w/o issue. Pt refused VS or signature.

## 2016-09-29 NOTE — Discharge Instructions (Signed)
Your exam reveals some acute inflammation and sprain to the thumb extensor tendons. Wear your thumb spica splint as directed. Follow-up with Dr. Rosita KeaMenz as scheduled.

## 2016-09-29 NOTE — ED Triage Notes (Signed)
Pt reports has been seeing orthopedist for left hand pain. Pt states she does not know what is wrong, they have told her that it is not arthritis or carpal tunnel. Pt states just now she dropped something at [facility] and after picking it up she can no longer move her left thumb.

## 2016-10-01 ENCOUNTER — Ambulatory Visit
Admission: RE | Admit: 2016-10-01 | Discharge: 2016-10-01 | Disposition: A | Discharge status: Home / Self Care | Payer: 59 | Source: Ambulatory Visit | Attending: Orthopedic Surgery | Admitting: Orthopedic Surgery

## 2016-10-01 DIAGNOSIS — M70032 Crepitant synovitis (acute) (chronic), left wrist: Secondary | ICD-10-CM | POA: Diagnosis present

## 2016-10-01 DIAGNOSIS — S52502S Unspecified fracture of the lower end of left radius, sequela: Secondary | ICD-10-CM | POA: Diagnosis not present

## 2016-10-02 NOTE — ED Provider Notes (Signed)
[facility] Emergency Department Provider Note ____________________________________________  Time seen: 1930   I have reviewed the triage vital signs and the nursing notes.  HISTORY  Chief Complaint  Hand Pain  HPI Sonya Rodriguez is a 56 y.o. female presents to the ED for evaluation of chronic pain to the left thumb. She's been evaluated by the last 3 months for left thumb pain. She presents to the ED today, because she was in the drug storeand apparently dropped something it up up due to severe, sharp, fleeting left thumb pain. She reports since that time she's had disability to a left thumb claiming she can not move her left thumb.she admits she is scheduled to have an MRI on Wednesday ordered by Dr. Rosita KeaMenz. [facility] and was told to come here, apparently for an emergent MRI. He denies any injury, accident, or trauma. She denies any cut, scrape, laceration. She denies any interim fevers. 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 She localizes pain to the radial aspect of the wrist following this acute injury.  Past Medical History:  Diagnosis Date  . Cystocele 5/17   fitted with a #6 ring pessary with support in 6/17  . Insomnia   . Migraine with aura   . Thyroid disease    Hyperthyroid    Patient Active Problem List   Diagnosis Date Noted  . Pain of left hand 06/25/2016  . Chronic obstructive pulmonary disease (HCC) 03/21/2016  . Cystocele   . Migraine with aura   . Hyperthyroidism 11/20/2014  . Tobacco abuse 11/20/2014  . Migraines 11/20/2014    Past Surgical History:  Procedure Laterality Date  . CHOLECYSTECTOMY    . ORIF DISTAL RADIUS FRACTURE Left 06/2011   Plate [facility] Screws[facility]  . TUBAL LIGATION      Prior to Admission medications   Medication Sig Start Date End Date  Taking? Authorizing Provider  albuterol (PROAIR HFA) 108 (90 Base) MCG/ACT inhaler INHALE 2 PUFFS INTO THE LUNGS EVERY 6 HOURS AS NEEDED FOR WHEEZING OR SHORTNESS OF BREATH 09/04/16   Doreene Nestlark, Katherine K, NP  budesonide-formoterol [facility](SYMBICORT) 160-4.5 MCG/ACT inhaler Inhale 2 puffs into the lungs 2 (two) times daily. 05/13/16   Doreene Nestlark, Katherine K, NP  cloNIDine HCl (KAPVAY) 0.1 MG TB12 ER tablet Take 0.1 mg by mouth at bedtime. 07/03/16   [provider]  diclofenac (VOLTAREN) 75 MG EC tablet Take 1 tablet (75 mg total) by mouth 2 (two) times daily as needed for moderate pain. 06/25/16   Doreene Nestlark, Katherine K, NP  DULoxetine (CYMBALTA) 30 MG capsule TAKE ONE CAPSULE BY MOUTH AT BEDTIME FOR MOOD 07/03/16   [provider]  HYDROcodone-acetaminophen (NORCO/VICODIN) 5-325 MG tablet Take 1 tablet by mouth every 6 (six) hours as needed for moderate pain.    [provider]  Melatonin (MELATONIN MAXIMUM STRENGTH) 5 MG TABS Take by mouth.    [provider]  methimazole (TAPAZOLE) 10 MG tablet Take by mouth. 01/03/15 06/25/16  [provider]  nystatin cream (MYCOSTATIN) Apply 1 application topically 2 (two) times daily. Apply to affected area BID for up to 7 days. 09/19/15   Romualdo BolkJertson, Jill Evelyn, MD  propranolol (INDERAL) 20 MG tablet Take 20 mg by mouth daily.  01/03/15 06/25/16  [provider]    Allergies  Patient has no known allergies.  Family History  Problem Relation Age of Onset  . Hyperlipidemia Mother   . Cancer Father        prostate  . Mental illness Father   . Colon cancer Maternal Grandmother     Social History Social History  Substance Use Topics  . Smoking status: Former Smoker    Packs/day: 0.50  . Smokeless tobacco: Never Used  . Alcohol use 0.6 oz/week    1 Standard drinks or equivalent per week     Comment: social    Review of Systems  Constitutional: Negative for fever. Cardiovascular: Negative for chest pain. Respiratory:  Negative for shortness of breath. Musculoskeletal: Negative for back pain. Left thumb pain Neurological: Negative for headaches, focal weakness or numbness. ____________________________________________  PHYSICAL EXAM:  VITAL SIGNS: ED Triage Vitals  Enc Vitals Group     BP 09/29/16 1851 126/67     Pulse Rate 09/29/16 1851 61     Resp 09/29/16 1851 18     Temp 09/29/16 1851 98.1 F (36.7 C)     Temp Source 09/29/16 1851 Oral     SpO2 09/29/16 1851 96 %     Weight 09/29/16 1852 155 lb (70.3 kg)     Height 09/29/16 1852 5\' 4"  (1.626 m)     Head Circumference --      Peak Flow --      Pain Score 09/29/16 1851 5     Pain Loc --      Pain Edu? --      Excl. in GC? --     Constitutional: Alert and oriented. Well appearing and in no distress. Head: Normocephalic and atraumatic. Cardiovascular: Normal rate, regular rhythm. Normal distal pulses. Respiratory: Normal respiratory effort.  Musculoskeletal: Left hand without obvious deformity, dislocation, or edema. Normal composite fist. Negative Finkelstein. Mildly tender to palp over the extensor tendon   Nontender with normal range of motion in all extremities.  Neurologic: CN II-XII grossly intact. Normal intrinsic & opposition testing. Normal gait without ataxia. Normal speech and language. No gross focal neurologic deficits are appreciated. Skin:  Skin is warm, dry and intact. No rash noted. ____________________________________________  INITIAL IMPRESSION / ASSESSMENT AND PLAN / ED COURSE  Patient presents to the ED for evaluation of acute left thumb pain. Her exam is otherwise benign showing no acute tendon rupture. She has some mild tinnitus over the extensor tendon. Patient is however irate, and upset that in her opinion, we are not "doing anything for her." She leaves the ED without signing her discharge paperwork. She is referred back to Dr. Rosita Kea for further evaluation and her MRI as scheduled on Wednesday. She is further  encouraged to wear her thumb spica splint as previously directed. ____________________________________________  FINAL CLINICAL IMPRESSION(S) / ED DIAGNOSES  Final diagnoses:  Left hand pain  Tendinitis of thumb  Sprain of left thumb, unspecified site of finger, initial encounter      Lissa Hoard, PA-C 10/02/16 1052    Loleta Rose, MD 10/02/16 1936

## 2016-10-14 ENCOUNTER — Encounter
Admission: RE | Admit: 2016-10-14 | Discharge: 2016-10-14 | Disposition: A | Discharge status: Home / Self Care | Payer: 59 | Source: Ambulatory Visit | Attending: Orthopedic Surgery | Admitting: Orthopedic Surgery

## 2016-10-14 HISTORY — DX: Family history of other specified conditions: Z84.89

## 2016-10-14 HISTORY — DX: Anxiety disorder, unspecified: F41.9

## 2016-10-14 HISTORY — DX: Bronchitis, not specified as acute or chronic: J40

## 2016-10-14 HISTORY — DX: Thyrotoxicosis, unspecified without thyrotoxic crisis or storm: E05.90

## 2016-10-14 HISTORY — DX: Other complications of anesthesia, initial encounter: T88.59XA

## 2016-10-14 HISTORY — DX: Chronic obstructive pulmonary disease, unspecified: J44.9

## 2016-10-14 HISTORY — DX: Adverse effect of unspecified anesthetic, initial encounter: T41.45XA

## 2016-10-14 NOTE — Patient Instructions (Signed)
  Your procedure is scheduled on: 10-15-16  Report to Same Day Surgery 2nd floor medical mall [facility](Medical Mall Entrance-take elevator on left to 2nd floor.  Check in with surgery information desk.) To find out your arrival time please call [phone] between 1PM - 3PM on 10-14-16  Remember: Instructions that are not followed completely may result in serious medical risk, up to and including death, or upon the discretion of your surgeon and anesthesiologist your surgery may need to be rescheduled.    _x___ 1. Do not eat food or drink liquids after midnight. No gum chewing or hard candies.     __x__ 2. No Alcohol for 24 hours before or after surgery.   __x__3. No Smoking for 24 prior to surgery.   ____  4. Bring all medications with you on the day of surgery if instructed.    __x__ 5. Notify your doctor if there is any change in your medical condition     (cold, fever, infections).     Do not wear jewelry, make-up, hairpins, clips or nail polish.  Do not wear lotions, powders, or perfumes. You may wear deodorant.  Do not shave 48 hours prior to surgery. Men may shave face and neck.  Do not bring valuables to the hospital.    [facility] is not responsible for any belongings or valuables.               Contacts, dentures or bridgework may not be worn into surgery.  Leave your suitcase in the car. After surgery it may be brought to your room.  For patients admitted to the hospital, discharge time is determined by your  treatment team.   Patients discharged the day of surgery will not be allowed to drive home.  You will need someone to drive you home and stay with you the night of your procedure.    Please read over the following fact sheets that you were given:    ____ Take anti-hypertensive (unless it includes a diuretic), cardiac, seizure, asthma,     anti-reflux and psychiatric medicines. These include:  1. NONE  2.  3.  4.  5.  6.  ____Fleets enema or Magnesium Citrate as  directed.   ____ Use CHG Soap or sage wipes as directed on instruction sheet   _X___ Use inhalers on the day of surgery and bring to hospital day of surgery-USE ALBUTEROL INHALER AT HOME AND BRING TO HOSPITAL  ____ Stop Metformin and Janumet 2 days prior to surgery.    ____ Take 1/2 of usual insulin dose the night before surgery and none on the morning     surgery.   ____ Follow recommendations from Cardiologist, Pulmonologist or PCP regarding          stopping Aspirin, Coumadin, Pllavix ,Eliquis, Effient, or Pradaxa, and Pletal.  X____Stop Anti-inflammatories such as Advil, Aleve, Ibuprofen, Motrin, Naproxen, Naprosyn, Goodies powders or aspirin products NOW-OK to take Tylenol OR TRAMADOL   _x___ Stop supplements until after surgery-STOP MELATONIN NOW   ____ Bring C-Pap to the hospital.

## 2016-10-15 ENCOUNTER — Ambulatory Visit
Admission: RE | Admit: 2016-10-15 | Discharge: 2016-10-15 | Disposition: A | Discharge status: Home / Self Care | Payer: 59 | Source: Ambulatory Visit | Attending: Orthopedic Surgery | Admitting: Orthopedic Surgery

## 2016-10-15 ENCOUNTER — Encounter: Payer: Self-pay | Admitting: *Deleted

## 2016-10-15 ENCOUNTER — Ambulatory Visit: Payer: 59 | Admitting: Anesthesiology

## 2016-10-15 ENCOUNTER — Encounter
Admission: RE | Disposition: A | Discharge status: Home / Self Care | Payer: Self-pay | Source: Ambulatory Visit | Attending: Orthopedic Surgery

## 2016-10-15 DIAGNOSIS — Z9889 Other specified postprocedural states: Secondary | ICD-10-CM | POA: Insufficient documentation

## 2016-10-15 DIAGNOSIS — M66242 Spontaneous rupture of extensor tendons, left hand: Secondary | ICD-10-CM | POA: Diagnosis present

## 2016-10-15 DIAGNOSIS — Z9049 Acquired absence of other specified parts of digestive tract: Secondary | ICD-10-CM | POA: Diagnosis not present

## 2016-10-15 DIAGNOSIS — Z87891 Personal history of nicotine dependence: Secondary | ICD-10-CM | POA: Diagnosis not present

## 2016-10-15 DIAGNOSIS — E039 Hypothyroidism, unspecified: Secondary | ICD-10-CM | POA: Insufficient documentation

## 2016-10-15 DIAGNOSIS — M654 Radial styloid tenosynovitis [de Quervain]: Secondary | ICD-10-CM | POA: Diagnosis not present

## 2016-10-15 DIAGNOSIS — Z8349 Family history of other endocrine, nutritional and metabolic diseases: Secondary | ICD-10-CM | POA: Insufficient documentation

## 2016-10-15 DIAGNOSIS — Z79899 Other long term (current) drug therapy: Secondary | ICD-10-CM | POA: Insufficient documentation

## 2016-10-15 HISTORY — PX: REPAIR EXTENSOR TENDON: SHX5382

## 2016-10-15 LAB — CBC
HCT: 44.9 % (ref 35.0–47.0)
HEMOGLOBIN: 15 g/dL (ref 12.0–16.0)
MCH: 30.2 pg (ref 26.0–34.0)
MCHC: 33.5 g/dL (ref 32.0–36.0)
MCV: 90.4 fL (ref 80.0–100.0)
Platelets: 245 10*3/uL (ref 150–440)
RBC: 4.97 MIL/uL (ref 3.80–5.20)
RDW: 13.3 % (ref 11.5–14.5)
WBC: 6 10*3/uL (ref 3.6–11.0)

## 2016-10-15 SURGERY — REPAIR, TENDON, EXTENSOR
Anesthesia: Regional | Site: Hand | Laterality: Left | Wound class: Clean

## 2016-10-15 MED ORDER — METOCLOPRAMIDE HCL 5 MG/ML IJ SOLN
5.0000 mg | Freq: Three times a day (TID) | INTRAMUSCULAR | Status: DC | PRN
  Filled 2016-10-15: qty 2

## 2016-10-15 MED ORDER — BUPIVACAINE HCL (PF) 0.5 % IJ SOLN
INTRAMUSCULAR | Status: DC | PRN
  Administered 2016-10-15: 10 mL

## 2016-10-15 MED ORDER — MIDAZOLAM HCL 2 MG/2ML IJ SOLN
INTRAMUSCULAR | Status: AC
  Administered 2016-10-15: 1 mg via INTRAVENOUS
  Filled 2016-10-15: qty 2

## 2016-10-15 MED ORDER — NEOMYCIN-POLYMYXIN B GU 40-200000 IR SOLN
Status: AC
  Filled 2016-10-15: qty 2

## 2016-10-15 MED ORDER — ROPIVACAINE HCL 5 MG/ML IJ SOLN
INTRAMUSCULAR | Status: DC | PRN
  Administered 2016-10-15: 6 mL via PERINEURAL
  Administered 2016-10-15: 14 mL via PERINEURAL

## 2016-10-15 MED ORDER — OXYCODONE HCL 5 MG/5ML PO SOLN: 5.0000 mg | Freq: Once | ORAL | Status: DC | PRN

## 2016-10-15 MED ORDER — METOCLOPRAMIDE HCL 10 MG PO TABS
5.0000 mg | ORAL_TABLET | Freq: Three times a day (TID) | ORAL | Status: DC | PRN
  Filled 2016-10-15: qty 1

## 2016-10-15 MED ORDER — PROPOFOL 500 MG/50ML IV EMUL
INTRAVENOUS | Status: DC | PRN
  Administered 2016-10-15: 100 ug/kg/min via INTRAVENOUS

## 2016-10-15 MED ORDER — OXYCODONE HCL 5 MG PO TABS: 5.0000 mg | ORAL_TABLET | Freq: Once | ORAL | 0 refills | Status: DC | PRN

## 2016-10-15 MED ORDER — FAMOTIDINE 20 MG PO TABS
ORAL_TABLET | ORAL | Status: AC
  Administered 2016-10-15: 20 mg via ORAL
  Filled 2016-10-15: qty 1

## 2016-10-15 MED ORDER — ONDANSETRON HCL 4 MG/2ML IJ SOLN
INTRAMUSCULAR | Status: DC | PRN
  Administered 2016-10-15: 4 mg via INTRAVENOUS

## 2016-10-15 MED ORDER — LIDOCAINE HCL (PF) 2 % IJ SOLN
INTRAMUSCULAR | Status: DC | PRN
  Administered 2016-10-15: 1 mL via INTRADERMAL
  Administered 2016-10-15: 4 mL via INTRADERMAL
  Administered 2016-10-15: 50 mL via INTRADERMAL
  Administered 2016-10-15: 6 mL via PERINEURAL

## 2016-10-15 MED ORDER — NEOMYCIN-POLYMYXIN B GU 40-200000 IR SOLN
Status: DC | PRN
  Administered 2016-10-15: 2 mL

## 2016-10-15 MED ORDER — FAMOTIDINE 20 MG PO TABS
20.0000 mg | ORAL_TABLET | Freq: Once | ORAL | Status: AC
  Administered 2016-10-15: 20 mg via ORAL

## 2016-10-15 MED ORDER — CEFAZOLIN SODIUM-DEXTROSE 2-4 GM/100ML-% IV SOLN
2.0000 g | Freq: Once | INTRAVENOUS | Status: AC
  Administered 2016-10-15: 2 g via INTRAVENOUS

## 2016-10-15 MED ORDER — CEFAZOLIN SODIUM-DEXTROSE 2-4 GM/100ML-% IV SOLN
INTRAVENOUS | Status: AC
  Filled 2016-10-15: qty 100

## 2016-10-15 MED ORDER — PROPOFOL 10 MG/ML IV BOLUS
INTRAVENOUS | Status: AC
  Filled 2016-10-15: qty 20

## 2016-10-15 MED ORDER — ONDANSETRON HCL 4 MG PO TABS
4.0000 mg | ORAL_TABLET | Freq: Four times a day (QID) | ORAL | Status: DC | PRN
  Filled 2016-10-15: qty 1

## 2016-10-15 MED ORDER — BUPIVACAINE HCL (PF) 0.5 % IJ SOLN
INTRAMUSCULAR | Status: AC
  Filled 2016-10-15: qty 30

## 2016-10-15 MED ORDER — KETAMINE HCL 50 MG/ML IJ SOLN
INTRAMUSCULAR | Status: DC | PRN
  Administered 2016-10-15: 30 mg via INTRAMUSCULAR

## 2016-10-15 MED ORDER — MIDAZOLAM HCL 2 MG/2ML IJ SOLN
1.0000 mg | Freq: Once | INTRAMUSCULAR | Status: AC
  Administered 2016-10-15: 1 mg via INTRAVENOUS

## 2016-10-15 MED ORDER — LACTATED RINGERS IV SOLN
INTRAVENOUS | Status: DC
  Administered 2016-10-15: 12:00:00 via INTRAVENOUS

## 2016-10-15 MED ORDER — FENTANYL CITRATE (PF) 100 MCG/2ML IJ SOLN
50.0000 ug | Freq: Once | INTRAMUSCULAR | Status: AC
Start: 2016-10-15 — End: 2016-10-15
  Administered 2016-10-15: 100 ug via INTRAVENOUS

## 2016-10-15 MED ORDER — PROPOFOL 10 MG/ML IV BOLUS
INTRAVENOUS | Status: AC
  Filled 2016-10-15: qty 40

## 2016-10-15 MED ORDER — LIDOCAINE HCL (PF) 2 % IJ SOLN
INTRAMUSCULAR | Status: AC
  Filled 2016-10-15: qty 2

## 2016-10-15 MED ORDER — FENTANYL CITRATE (PF) 100 MCG/2ML IJ SOLN
INTRAMUSCULAR | Status: AC
  Administered 2016-10-15: 100 ug via INTRAVENOUS
  Filled 2016-10-15: qty 2

## 2016-10-15 MED ORDER — ROPIVACAINE HCL 5 MG/ML IJ SOLN
INTRAMUSCULAR | Status: AC
  Filled 2016-10-15: qty 30

## 2016-10-15 MED ORDER — ONDANSETRON HCL 4 MG/2ML IJ SOLN
4.0000 mg | Freq: Four times a day (QID) | INTRAMUSCULAR | Status: DC | PRN
  Filled 2016-10-15: qty 2

## 2016-10-15 MED ORDER — FENTANYL CITRATE (PF) 100 MCG/2ML IJ SOLN: 25.0000 ug | INTRAMUSCULAR | Status: DC | PRN

## 2016-10-15 MED ORDER — OXYCODONE HCL 5 MG PO TABS: 5.0000 mg | ORAL_TABLET | Freq: Once | ORAL | Status: DC | PRN

## 2016-10-15 MED ORDER — HYDROCODONE-ACETAMINOPHEN 5-325 MG PO TABS: 1.0000 | ORAL_TABLET | ORAL | Status: DC | PRN

## 2016-10-15 MED ORDER — SODIUM CHLORIDE 0.9 % IV SOLN: INTRAVENOUS | Status: DC

## 2016-10-15 MED ORDER — LIDOCAINE HCL (PF) 2 % IJ SOLN
INTRAMUSCULAR | Status: AC
  Filled 2016-10-15: qty 20

## 2016-10-15 SURGICAL SUPPLY — 35 items
BANDAGE ELASTIC 4 CLIP NS LF (GAUZE/BANDAGES/DRESSINGS) ×2 IMPLANT
BNDG ESMARK 4X12 TAN STRL LF (GAUZE/BANDAGES/DRESSINGS) ×2 IMPLANT
CANISTER SUCT 1200ML W/VALVE (MISCELLANEOUS) ×2 IMPLANT
CHLORAPREP W/TINT 26ML (MISCELLANEOUS) ×2 IMPLANT
CUFF TOURN SGL QUICK 18 (TOURNIQUET CUFF) IMPLANT
CUFF TOURN SGL QUICK 24 (TOURNIQUET CUFF)
CUFF TRNQT CYL 24X4X40X1 (TOURNIQUET CUFF) IMPLANT
ELECT CAUTERY BLADE 6.4 (BLADE) ×2 IMPLANT
GAUZE PETRO XEROFOAM 1X8 (MISCELLANEOUS) ×2 IMPLANT
GAUZE SPONGE 4X4 12PLY STRL (GAUZE/BANDAGES/DRESSINGS) ×2 IMPLANT
GLOVE BIOGEL PI IND STRL 9 (GLOVE) ×1 IMPLANT
GLOVE BIOGEL PI INDICATOR 9 (GLOVE) ×1
GLOVE SURG SYN 9.0  PF PI (GLOVE) ×1
GLOVE SURG SYN 9.0 PF PI (GLOVE) ×1 IMPLANT
GOWN SRG 2XL LVL 4 RGLN SLV (GOWNS) ×1 IMPLANT
GOWN STRL NON-REIN 2XL LVL4 (GOWNS) ×1
GOWN STRL REUS W/ TWL LRG LVL3 (GOWN DISPOSABLE) ×1 IMPLANT
GOWN STRL REUS W/TWL LRG LVL3 (GOWN DISPOSABLE) ×1
KIT RM TURNOVER STRD PROC AR (KITS) ×2 IMPLANT
NDL SAFETY 25GX1.5 (NEEDLE) ×2 IMPLANT
NS IRRIG 500ML POUR BTL (IV SOLUTION) ×2 IMPLANT
PACK EXTREMITY ARMC (MISCELLANEOUS) ×2 IMPLANT
PAD CAST CTTN 4X4 STRL (SOFTGOODS) ×1 IMPLANT
PAD GROUND ADULT SPLIT (MISCELLANEOUS) ×2 IMPLANT
PADDING CAST COTTON 4X4 STRL (SOFTGOODS) ×1
SPLINT CAST 1 STEP 4X15 (MISCELLANEOUS) ×2 IMPLANT
STOCKINETTE STRL 4IN 9604848 (GAUZE/BANDAGES/DRESSINGS) ×2 IMPLANT
SUT ETHILON 4 0 P 3 18 (SUTURE) IMPLANT
SUT ETHILON 4-0 (SUTURE)
SUT ETHILON 4-0 FS2 18XMFL BLK (SUTURE)
SUT ETHILON 5 0 P 3 18 (SUTURE)
SUT MERSILENE 4-0 WHT RB-1 (SUTURE) IMPLANT
SUT NYLON ETHILON 5-0 P-3 1X18 (SUTURE) IMPLANT
SUTURE ETHLN 4-0 FS2 18XMF BLK (SUTURE) IMPLANT
SYRINGE 10CC LL (SYRINGE) ×2 IMPLANT

## 2016-10-15 NOTE — Anesthesia Preprocedure Evaluation (Addendum)
Anesthesia Evaluation  Patient identified by MRN, date of birth, ID band Patient awake    Reviewed: Allergy & Precautions, H&P , NPO status , Patient's Chart, lab work & pertinent test results  History of Anesthesia Complications (+) Family history of anesthesia reaction and history of anesthetic complications  Airway Mallampati: III  TM Distance: <3 FB Neck ROM: limited    Dental  (+) Poor Dentition, Chipped   Pulmonary neg shortness of breath, COPD, former smoker,           Cardiovascular Exercise Tolerance: Good (-) angina(-) Past MI and (-) DOE negative cardio ROS       Neuro/Psych  Headaches, PSYCHIATRIC DISORDERS Anxiety    GI/Hepatic negative GI ROS, Neg liver ROS,   Endo/Other  Hyperthyroidism   Renal/GU negative Renal ROS  negative genitourinary   Musculoskeletal   Abdominal   Peds  Hematology negative hematology ROS (+)   Anesthesia Other Findings Patient endorses baseline weakness and numbness in left hand  Past Medical History: No date: Anxiety     Comment: attack when her mom died 08/2016: Bronchitis     Comment: pt states mostly resolved but still has dry               cough intermittently No date: Complication of anesthesia     Comment: woke up during arm surgery-pain not controlled              in PACU after arm surgery No date: COPD (chronic obstructive pulmonary disease) (* 5/17: Cystocele     Comment: fitted with a #6 ring pessary with support in               6/17 No date: Family history of adverse reaction to anesthes*     Comment: son-agitated when coming out of anesthesia (he              was only 666-56 years old) No date: Hyperthyroidism     Comment: no meds No date: Insomnia No date: Migraine with aura No date: Thyroid disease     Comment: Hyperthyroid  Past Surgical History: No date: CHOLECYSTECTOMY 06/2011: ORIF DISTAL RADIUS FRACTURE Left     Comment: Plate + Screws, Dr.  Rosita KeaMenz, [facility] No date: TUBAL LIGATION  BMI    Body Mass Index:  25.92 kg/m      Reproductive/Obstetrics negative OB ROS                            Anesthesia Physical Anesthesia Plan  ASA: III  Anesthesia Plan: Regional   Post-op Pain Management:  Regional for Post-op pain   Induction: Intravenous  PONV Risk Score and Plan: 2 and Ondansetron and Dexamethasone  Airway Management Planned: Natural Airway and Nasal Cannula  Additional Equipment:   Intra-op Plan:   Post-operative Plan:   Informed Consent: I have reviewed the patients History and Physical, chart, labs and discussed the procedure including the risks, benefits and alternatives for the proposed anesthesia with the patient or authorized representative who has indicated his/her understanding and acceptance.   Dental Advisory Given  Plan Discussed with: Anesthesiologist, CRNA and Surgeon  Anesthesia Plan Comments: (Plan for surgical regional block with backup GA  Patient consented for risks of anesthesia including but not limited to:  - adverse reactions to medications - damage to teeth, lips or other oral mucosa - sore throat or hoarseness - Damage to heart, brain, lungs or loss of life  Patient voiced understanding.)        Anesthesia Quick Evaluation

## 2016-10-15 NOTE — Transfer of Care (Signed)
Immediate Anesthesia Transfer of Care Note  Patient: Sonya Rodriguez  Procedure(s) Performed: Procedure(s): REPAIR EXTENSOR TENDON- EIP TO EPL TRANSFER-LEFT HAND (Left)  Patient Location: PACU  Anesthesia Type:General  Level of Consciousness: sedated  Airway & Oxygen Therapy: Patient Spontanous Breathing and Patient connected to face mask oxygen  Post-op Assessment: Report given to RN and Post -op Vital signs reviewed and stable  Post vital signs: Reviewed and stable  Last Vitals:  Vitals:   10/15/16 1407 10/15/16 1515  BP: (!) 141/78 117/72  Pulse: 72   Resp: 14   Temp:  36.4 C    Complications: No apparent anesthesia complications

## 2016-10-15 NOTE — Progress Notes (Signed)
Dr. Rosita KeaMenz into see   Sling applied before discharge

## 2016-10-15 NOTE — H&P (Signed)
Reviewed paper H+P, will be scanned into chart. No changes noted.  

## 2016-10-15 NOTE — Discharge Instructions (Addendum)
°  Keep splint clean and dry. Okay to work fingers but not thumb. Elevate hand. Okay to ice as well

## 2016-10-15 NOTE — Anesthesia Procedure Notes (Signed)
Performed by: Dorcas Melito Pre-anesthesia Checklist: Patient identified, Emergency Drugs available, Suction available, Patient being monitored and Timeout performed Patient Re-evaluated:Patient Re-evaluated prior to inductionOxygen Delivery Method: Nasal cannula Preoxygenation: Pre-oxygenation with 100% oxygen Intubation Type: IV induction       

## 2016-10-15 NOTE — Anesthesia Post-op Follow-up Note (Cosign Needed)
Anesthesia QCDR form completed.        

## 2016-10-15 NOTE — Progress Notes (Signed)
Pt very concerned from having block  Numbness no movement to arm  Funny feeling   In arm   Dr piscitello in to reaasure pt

## 2016-10-15 NOTE — OR Nursing (Signed)
Transported to PACU bay 8 for a nerve block. Report given to Dory Larsenebbie Knowles RN.

## 2016-10-15 NOTE — Op Note (Signed)
10/15/2016  3:12 PM  PATIENT:  Sonya Rodriguez  56 y.o. female  PRE-OPERATIVE DIAGNOSIS:  extensor tendon rupture of left hand  POST-OPERATIVE DIAGNOSIS:  extensor tendon rupture of left hand  PROCEDURE:  Procedure(s): REPAIR EXTENSOR TENDON- EIP TO EPL TRANSFER-LEFT HAND (Left)  SURGEON: Leitha SchullerMichael J Galdino Hinchman, MD  ASSISTANTS: None  ANESTHESIA:   regional  EBL:  No intake/output data recorded.  BLOOD ADMINISTERED:none  DRAINS: none   LOCAL MEDICATIONS USED:  MARCAINE     SPECIMEN:  No Specimen  DISPOSITION OF SPECIMEN:  N/A  COUNTS:  YES  TOURNIQUET:   34 minutes at 250 mmHg  IMPLANTS: None  DICTATION: .Dragon Dictation patient brought the operating room and after adequate anesthesia had been obtained with a block the left arm was prepped and draped in sterile fashion. After patient identification and timeout procedures were completed, tourniquet was raised. Initial incision was made along the first metacarpal of the thumb and the EPL exposed and found to be ruptured. Next incision was made at the index finger over the MCP joint and the EIP tendon identified and it that following this the dorsal wrist was opened with a transverse incision and the extensor tendons identified the EIP tendon was identified proximally and then cut distally so that it could be pulled back through the wound at the wrist where was rerouted to the EPL. A Pulvertaft weave was then utilized with the thumb in hyperextension with 4-0 Ethibond used to hold the tendon in place and suture at each of the patient where it was woven through the deep distal EPL. The EIP tendon was then sutured over the Pulvertaft weave with an additional suture to reinforce the repair. Following this the wounds were closed with a 4-0 Monocryl followed by Dermabond. The Dermabond was set Telfa was applied by 4 x 4's web roll and a thumb spica splint holding the thumb in hyperextension.  PLAN OF CARE: Discharge to home after  PACU  PATIENT DISPOSITION:  PACU - hemodynamically stable.

## 2016-10-15 NOTE — Anesthesia Procedure Notes (Signed)
Anesthesia Regional Block: Supraclavicular block   Pre-Anesthetic Checklist: ,, timeout performed, Correct Patient, Correct Site, Correct Laterality, Correct Procedure, Correct Position, site marked, Risks and benefits discussed,  Surgical consent,  Pre-op evaluation,  At surgeon's request and post-op pain management  Laterality: Upper and Left  Prep: chloraprep       Needles:  Injection technique: Single-shot  Needle Type: Stimiplex     Needle Length: 5cm  Needle Gauge: 22     Additional Needles:   Procedures: ultrasound guided,,,,,,,,  Narrative:  Start time: 10/15/2016 1:39 PM End time: 10/15/2016 1:41 PM Injection made incrementally with aspirations every 5 mL.  Performed by: Personally  Anesthesiologist: Margorie JohnPISCITELLO, Kristeena Meineke K  Additional Notes: Patient endorses baseline weakness and numbness in left hand  Functioning IV was confirmed and monitors were applied.  A 50mm 22ga Stimuplex needle was used. Sterile prep,hand hygiene and sterile gloves were used.  Minimal sedation used for procedure.  No paresthesia endorsed by patient during the procedure.  Negative aspiration and negative test dose prior to incremental administration of local anesthetic. The patient tolerated the procedure well with no immediate complications.

## 2016-10-15 NOTE — Progress Notes (Signed)
Ice pack to left hand   Can wiggle fingers   But not moving thumb as indicated

## 2016-10-15 NOTE — Progress Notes (Signed)
Left arm elevated on pillow    Capillary refill positive to left hand

## 2016-10-16 ENCOUNTER — Encounter: Payer: Self-pay | Admitting: Orthopedic Surgery

## 2016-10-16 NOTE — Anesthesia Postprocedure Evaluation (Signed)
Anesthesia Post Note  Patient: Sonya Rodriguez  Procedure(s) Performed: Procedure(s) (LRB): REPAIR EXTENSOR TENDON- EIP TO EPL TRANSFER-LEFT HAND (Left)  Patient location during evaluation: PACU Anesthesia Type: Regional Level of consciousness: awake and alert Pain management: pain level controlled Vital Signs Assessment: post-procedure vital signs reviewed and stable Respiratory status: spontaneous breathing, nonlabored ventilation, respiratory function stable and patient connected to nasal cannula oxygen Cardiovascular status: blood pressure returned to baseline and stable Postop Assessment: no signs of nausea or vomiting Anesthetic complications: no     Last Vitals:  Vitals:   10/15/16 1608 10/15/16 1621  BP: 128/79 (!) 141/65  Pulse: 66 61  Resp: 16 16  Temp:  (!) 35.9 C    Last Pain:  Vitals:   10/16/16 0819  TempSrc:   PainSc: 4                  Cleda MccreedyJoseph K Tyrisha Benninger

## 2016-10-29 ENCOUNTER — Emergency Department: Payer: 59

## 2016-10-29 ENCOUNTER — Encounter: Payer: Self-pay | Admitting: Radiology

## 2016-10-29 ENCOUNTER — Emergency Department
Admission: EM | Admit: 2016-10-29 | Discharge: 2016-10-29 | Disposition: A | Discharge status: Home / Self Care | Payer: 59 | Attending: Emergency Medicine | Admitting: Emergency Medicine

## 2016-10-29 ENCOUNTER — Other Ambulatory Visit: Payer: Self-pay

## 2016-10-29 DIAGNOSIS — J40 Bronchitis, not specified as acute or chronic: Secondary | ICD-10-CM | POA: Diagnosis not present

## 2016-10-29 DIAGNOSIS — Z87891 Personal history of nicotine dependence: Secondary | ICD-10-CM | POA: Insufficient documentation

## 2016-10-29 DIAGNOSIS — R0602 Shortness of breath: Secondary | ICD-10-CM | POA: Diagnosis present

## 2016-10-29 DIAGNOSIS — Z79899 Other long term (current) drug therapy: Secondary | ICD-10-CM | POA: Diagnosis not present

## 2016-10-29 DIAGNOSIS — J449 Chronic obstructive pulmonary disease, unspecified: Secondary | ICD-10-CM | POA: Diagnosis not present

## 2016-10-29 LAB — COMPREHENSIVE METABOLIC PANEL
ALBUMIN: 4.6 g/dL (ref 3.5–5.0)
ALK PHOS: 70 U/L (ref 38–126)
ALT: 19 U/L (ref 14–54)
AST: 20 U/L (ref 15–41)
Anion gap: 7 (ref 5–15)
BILIRUBIN TOTAL: 0.5 mg/dL (ref 0.3–1.2)
BUN: 10 mg/dL (ref 6–20)
CALCIUM: 9.4 mg/dL (ref 8.9–10.3)
CO2: 23 mmol/L (ref 22–32)
CREATININE: 0.61 mg/dL (ref 0.44–1.00)
Chloride: 112 mmol/L — ABNORMAL HIGH (ref 101–111)
GFR calc Af Amer: >60 mL/min (ref >60)
GFR calc non Af Amer: >60 mL/min (ref >60)
GLUCOSE: 83 mg/dL (ref 65–99)
Potassium: 4 mmol/L (ref 3.5–5.1)
Sodium: 142 mmol/L (ref 135–145)
TOTAL PROTEIN: 8 g/dL (ref 6.5–8.1)

## 2016-10-29 LAB — CBC WITH DIFFERENTIAL/PLATELET
BASOS ABS: 0.1 10*3/uL (ref 0–0.1)
BASOS PCT: 1 %
Eosinophils Absolute: 1.6 10*3/uL — ABNORMAL HIGH (ref 0–0.7)
Eosinophils Relative: 17 %
HEMATOCRIT: 46.2 % (ref 35.0–47.0)
HEMOGLOBIN: 15.7 g/dL (ref 12.0–16.0)
Lymphocytes Relative: 20 %
Lymphs Abs: 1.9 10*3/uL (ref 1.0–3.6)
MCH: 30.5 pg (ref 26.0–34.0)
MCHC: 34 g/dL (ref 32.0–36.0)
MCV: 89.5 fL (ref 80.0–100.0)
MONOS PCT: 6 %
Monocytes Absolute: 0.6 10*3/uL (ref 0.2–0.9)
NEUTROS ABS: 5.2 10*3/uL (ref 1.4–6.5)
NEUTROS PCT: 56 %
Platelets: 322 10*3/uL (ref 150–440)
RBC: 5.16 MIL/uL (ref 3.80–5.20)
RDW: 13 % (ref 11.5–14.5)
WBC: 9.3 10*3/uL (ref 3.6–11.0)

## 2016-10-29 LAB — FIBRIN DERIVATIVES D-DIMER (ARMC ONLY): Fibrin derivatives D-dimer (ARMC): 586.47 — ABNORMAL HIGH (ref 0.00–499.00)

## 2016-10-29 LAB — TROPONIN I: Troponin I: <0.03 ng/mL (ref <0.03)

## 2016-10-29 MED ORDER — ALBUTEROL SULFATE HFA 108 (90 BASE) MCG/ACT IN AERS: 2.0000 | INHALATION_SPRAY | Freq: Four times a day (QID) | RESPIRATORY_TRACT | 2 refills | Status: AC | PRN

## 2016-10-29 MED ORDER — IPRATROPIUM-ALBUTEROL 0.5-2.5 (3) MG/3ML IN SOLN
3.0000 mL | Freq: Once | RESPIRATORY_TRACT | Status: AC
  Administered 2016-10-29: 3 mL via RESPIRATORY_TRACT
  Filled 2016-10-29: qty 3

## 2016-10-29 MED ORDER — PREDNISONE 20 MG PO TABS
20.0000 mg | ORAL_TABLET | Freq: Every day | ORAL | 0 refills | Status: DC
Start: 2016-10-29 — End: 2016-11-10

## 2016-10-29 MED ORDER — PREDNISONE 20 MG PO TABS
60.0000 mg | ORAL_TABLET | Freq: Once | ORAL | Status: AC
  Administered 2016-10-29: 60 mg via ORAL
  Filled 2016-10-29: qty 3

## 2016-10-29 MED ORDER — IPRATROPIUM-ALBUTEROL 0.5-2.5 (3) MG/3ML IN SOLN
3.0000 mL | Freq: Once | RESPIRATORY_TRACT | Status: AC
  Administered 2016-10-29: 3 mL via RESPIRATORY_TRACT
  Filled 2016-10-29: qty 6

## 2016-10-29 MED ORDER — IOPAMIDOL (ISOVUE-370) INJECTION 76%
75.0000 mL | Freq: Once | INTRAVENOUS | Status: AC | PRN
  Administered 2016-10-29: 75 mL via INTRAVENOUS

## 2016-10-29 MED ORDER — AZITHROMYCIN 250 MG PO TABS: ORAL_TABLET | ORAL | 0 refills | Status: AC

## 2016-10-29 NOTE — Discharge Instructions (Signed)
Use the albuterol inhaler 2 puffs 4 times a day as needed for the cough and if you have any wheezing. Take the prednisone one pill a day starting tomorrow and use the Zithromax as directed. Please return if you're worse have more chest tightness wheezing or any other complaints. Please follow-up with the orthopedic doctor as scheduled and follow-up with your regular family doctor for the cough.

## 2016-10-29 NOTE — ED Notes (Signed)
Patient verbalizes understanding of d/c instructions and follow-up. VS stable and pain controlled per patient.  Patient in NAD at time of d/c and denies further concerns regarding this visit. Patient stable at the time of departure from the unit, departing unit by the safest and most appropriate manner per that patients condition and limitations. Patient advised to return to the ED at any time for emergent concerns, or for new/worsening symptoms.   

## 2016-10-29 NOTE — ED Notes (Signed)
MD Malinda at bedside. Pt to ED with c/o of SOB x2 weeks with cough. Pt states has been ongoing since her ortho surgery.

## 2016-10-29 NOTE — ED Provider Notes (Signed)
[facility] Emergency Department Provider Note   ____________________________________________   First MD Initiated Contact with Patient 10/29/16 1752     (approximate)  I have reviewed the triage vital signs and the nursing notes.   HISTORY  Chief Complaint Shortness of Breath    HPI Thurston Holenne Samara DeistMarie Alkire is a 56 y.o. female complaining of shortness of breath for 2 weeks since she had surgery on her thumb extensor tendon. Patient reports that she lays down she feels like she is drowning she coughs a lot. She has no edema no fever cough productive of small amounts of phlegm on some occasions. Patient has a history of COPD and still smokes. Patient has no edema.   Past Medical History:  Diagnosis Date  . Anxiety    attack when her mom died  . Bronchitis 08/2016   pt states mostly resolved but still has dry cough intermittently  . Complication of anesthesia    woke up during arm surgery-pain not controlled in PACU after arm surgery  . COPD (chronic obstructive pulmonary disease) (HCC)   . Cystocele 5/17   fitted with a #6 ring pessary with support in 6/17  . Family history of adverse reaction to anesthesia    son-agitated when coming out of anesthesia (he was only 716-56 years old)  . Hyperthyroidism    no meds  . Insomnia   . Migraine with aura   . Thyroid disease    Hyperthyroid    Patient Active Problem List   Diagnosis Date Noted  . Pain of left hand 06/25/2016  . Chronic obstructive pulmonary disease (HCC) 03/21/2016  . Cystocele   . Migraine with aura   . Hyperthyroidism 11/20/2014  . Tobacco abuse 11/20/2014  . Migraines 11/20/2014    Past Surgical History:  Procedure Laterality Date  . CHOLECYSTECTOMY    . ORIF DISTAL RADIUS FRACTURE Left 06/2011   Plate + Screws, [facility]  . REPAIR EXTENSOR TENDON Left 10/15/2016   Procedure: REPAIR EXTENSOR TENDON- EIP TO EPL TRANSFER-LEFT HAND;  Surgeon: Kennedy BuckerMenz, Michael, MD;  Location:  [facility] ORS;  Service: Orthopedics;  Laterality: Left;  . TUBAL LIGATION      Prior to Admission medications   Medication Sig Start Date End Date Taking? Authorizing Provider  albuterol (PROAIR HFA) 108 (90 Base) MCG/ACT inhaler INHALE 2 PUFFS INTO THE LUNGS EVERY 6 HOURS AS NEEDED FOR WHEEZING OR SHORTNESS OF BREATH 09/04/16   Doreene Nestlark, Katherine K, NP  albuterol (PROVENTIL HFA;VENTOLIN HFA) 108 (90 Base) MCG/ACT inhaler Inhale 2 puffs into the lungs every 6 (six) hours as needed for wheezing or shortness of breath. 10/29/16   Arnaldo NatalMalinda, Kenyatte Chatmon F, MD  azithromycin (ZITHROMAX Z-PAK) 250 MG tablet Take 2 tablets (500 mg) on  Day 1,  followed by 1 tablet (250 mg) once daily on Days 2 through 5. 10/29/16 11/03/16  Arnaldo NatalMalinda, Marcelles Clinard F, MD  budesonide-formoterol [facility](SYMBICORT) 160-4.5 MCG/ACT inhaler Inhale 2 puffs into the lungs 2 (two) times daily. 05/13/16   Doreene Nestlark, Katherine K, NP  diphenhydrAMINE (BENADRYL) 25 MG tablet Take 25 mg by mouth daily as needed for itching or allergies.    [provider]  diphenhydramine-acetaminophen (TYLENOL PM) 25-500 MG TABS tablet Take 1 tablet by mouth at bedtime as needed (sleep).    [provider]  Melatonin 5 MG TABS Take 10 mg by mouth at bedtime.    [provider]  nystatin cream (MYCOSTATIN) Apply 1 application topically 2 (two) times daily. Apply to  affected area BID for up to 7 days. Patient taking differently: Apply 1 application topically 2 (two) times daily as needed (rash).  09/19/15   Romualdo Bolk, MD  oxyCODONE (OXY IR/ROXICODONE) 5 MG immediate release tablet Take 1 tablet (5 mg total) by mouth once as needed (for pain score of 1-4). 10/15/16   Kennedy Bucker, MD  predniSONE (DELTASONE) 20 MG tablet Take 1 tablet (20 mg total) by mouth daily. 10/29/16 10/29/17  Arnaldo Natal, MD  Tetrahydrozoline HCl (VISINE OP) Apply 1 drop to eye daily as needed (dry eyes).    [provider]  traMADol (ULTRAM) 50 MG tablet Take 50-100 mg by  mouth 2 (two) times daily as needed for moderate pain.    [provider]    Allergies Patient has no known allergies.  Family History  Problem Relation Age of Onset  . Hyperlipidemia Mother   . Cancer Father        prostate  . Mental illness Father   . Colon cancer Maternal Grandmother     Social History Social History  Substance Use Topics  . Smoking status: Former Smoker    Packs/day: 0.50    Years: 30.00    Types: Cigarettes    Quit date: 10/15/2015  . Smokeless tobacco: Never Used  . Alcohol use No    Review of Systems  Constitutional: No fever/chills Eyes: No visual changes. ENT: No sore throat. Cardiovascular: Denies chest pain. Respiratory:shortness of breath. Gastrointestinal: No abdominal pain.  No nausea, no vomiting.  No diarrhea.  No constipation. Genitourinary: Negative for dysuria. Musculoskeletal: Negative for back pain. Skin: Negative for rash. Neurological: Negative for headaches, focal weakness or numbness.   ____________________________________________   PHYSICAL EXAM:  VITAL SIGNS: ED Triage Vitals [10/29/16 1758]  Enc Vitals Group     BP      Pulse      Resp      Temp      Temp src      SpO2      Weight      Height      Head Circumference      Peak Flow      Pain Score 7     Pain Loc      Pain Edu?      Excl. in GC?     Constitutional: Alert and oriented. Well appearing and in no acute distress. Eyes: Conjunctivae are normal. PERRL. EOMI. Head: Atraumatic. Nose: No congestion/rhinnorhea. Mouth/Throat: Mucous membranes are moist.  Oropharynx non-erythematous. Neck: No stridor.  Cardiovascular: Normal rate, regular rhythm. Grossly normal heart sounds.  Good peripheral circulation. Respiratory: Normal respiratory effort.  No retractions. Lungs Some wheezing and face is only Gastrointestinal: Soft and nontender. No distention. No abdominal bruits. No CVA tenderness. Musculoskeletal: No lower extremity tenderness nor  edema.  No joint effusions. Neurologic:  Normal speech and language. No gross focal neurologic deficits are appreciated. No gait instability. Skin:  Skin is warm, dry and intact. No rash noted. Psychiatric: Mood and affect are normal. Speech and behavior are normal.  ____________________________________________   LABS (all labs ordered are listed, but only abnormal results are displayed)  Labs Reviewed  CBC WITH DIFFERENTIAL/PLATELET - Abnormal; Notable for the following:       Result Value   Eosinophils Absolute 1.6 (*)    All other components within normal limits  COMPREHENSIVE METABOLIC PANEL - Abnormal; Notable for the following:    Chloride 112 (*)    All other components  within normal limits  FIBRIN DERIVATIVES D-DIMER ([facility] ONLY) - Abnormal; Notable for the following:    Fibrin derivatives D-dimer [facility]) 586.47 (*)    All other components within normal limits  TROPONIN I   ____________________________________________  EKG  EKG read and interpreted by me shows normal sinus rhythm rate of 79 normal axis no acute ST-T wave changes ____________________________________________  RADIOLOGY  _IMPRESSION: 1. No pulmonary embolus. 2. No acute intrathoracic abnormality.   Electronically Signed   By: Rubye Oaks M.D.   On: 10/29/2016 20:54___________________________________________   PROCEDURES  Procedure(s) performed:   Procedures  Critical Care performed:  ____________________________________________   INITIAL IMPRESSION / ASSESSMENT AND PLAN / ED COURSE  Pertinent labs & imaging results that were available during my care of the patient were reviewed by me and considered in my medical decision making (see chart for details).   Patient feels better after nebs lungs are clear will discharge with steroids for a few days inhaler and antibiotics.     ____________________________________________   FINAL CLINICAL IMPRESSION(S) / ED DIAGNOSES  Final  diagnoses:  Bronchitis      NEW MEDICATIONS STARTED DURING THIS VISIT:  New Prescriptions   ALBUTEROL (PROVENTIL HFA;VENTOLIN HFA) 108 (90 BASE) MCG/ACT INHALER    Inhale 2 puffs into the lungs every 6 (six) hours as needed for wheezing or shortness of breath.   AZITHROMYCIN (ZITHROMAX Z-PAK) 250 MG TABLET    Take 2 tablets (500 mg) on  Day 1,  followed by 1 tablet (250 mg) once daily on Days 2 through 5.   PREDNISONE (DELTASONE) 20 MG TABLET    Take 1 tablet (20 mg total) by mouth daily.     Note:  This document was prepared using Dragon voice recognition software and may include unintentional dictation errors.    Arnaldo Natal, MD 10/29/16 2105

## 2016-10-29 NOTE — ED Notes (Signed)
Called lab d-dimer is still in process.

## 2016-10-29 NOTE — ED Notes (Signed)
Patient transported to CT 

## 2016-10-31 ENCOUNTER — Ambulatory Visit: Payer: Self-pay | Admitting: Primary Care

## 2016-11-10 ENCOUNTER — Encounter: Payer: Self-pay | Admitting: Primary Care

## 2016-11-10 ENCOUNTER — Ambulatory Visit (INDEPENDENT_AMBULATORY_CARE_PROVIDER_SITE_OTHER): Payer: 59 | Admitting: Primary Care

## 2016-11-10 VITALS — BP 122/82 | HR 72 | Temp 98.0°F | Ht 64.0 in | Wt 157.4 lb

## 2016-11-10 DIAGNOSIS — J449 Chronic obstructive pulmonary disease, unspecified: Secondary | ICD-10-CM

## 2016-11-10 DIAGNOSIS — R05 Cough: Secondary | ICD-10-CM | POA: Diagnosis not present

## 2016-11-10 DIAGNOSIS — R059 Cough, unspecified: Secondary | ICD-10-CM

## 2016-11-10 MED ORDER — FLUTICASONE FUROATE-VILANTEROL 100-25 MCG/INH IN AEPB: 1.0000 | INHALATION_SPRAY | Freq: Every day | RESPIRATORY_TRACT | 11 refills | Status: DC

## 2016-11-10 MED ORDER — BUDESONIDE-FORMOTEROL FUMARATE 160-4.5 MCG/ACT IN AERO: 2.0000 | INHALATION_SPRAY | Freq: Two times a day (BID) | RESPIRATORY_TRACT | 5 refills | Status: DC

## 2016-11-10 MED ORDER — PREDNISONE 20 MG PO TABS: ORAL_TABLET | ORAL | 0 refills | Status: DC

## 2016-11-10 NOTE — Patient Instructions (Addendum)
Start prednisone 20 mg tablets. Take 2 tablets daily for 5 days.  Use the albuterol inhaler as needed for shortness of breath/wheezing.  Start fluticasone furoate and vilanterol (Breo) inhalation for COPD. Inhale into the lungs once daily, everyday.Rinse your mouth with water after each use. This is important to control your symptoms.  Please notify me if this is too expensive.   It was a pleasure to see you today!

## 2016-11-10 NOTE — Progress Notes (Signed)
Subjective:    Patient ID: Sonya Rodriguez, female    DOB: 19-Feb-1961, 56 y.o.   MRN: [id number]  HPI  Sonya Rodriguez is a 56 year old female with history of tobacco abuse, COPD who presents today with a chief complaint of cough. She is currently prescribed Symbicort 160-4.5 mcg BID and albuterol inhaler PRN.  She was evaluated on 10/29/16 at [facility] ED for a two week complaint of shortness of breath and cough. Chest xray was clear. CT angio of chest was negative for pulmonary embolus. She was provided with albuterol nebulized treatments and felt improved. She was discharged home on albuterol HFA, Azithromycin, and Prednisone 20 mg tablets.   Since her ED visit she's not much improved. Overall she feels well except for her chronic cough. She's not using her Symbicort as she cannot afford. She's using her albuterol inhaler numerous times daily with temporary improvement. She's been taking Delsym and Nyquil at home without much relief.     Review of Systems  Constitutional: Negative for chills, fatigue and fever.  HENT: Negative for congestion.   Respiratory: Positive for cough, shortness of breath and wheezing.   Cardiovascular: Negative for chest pain.  Neurological: Negative for headaches.       Past Medical History:  Diagnosis Date  . Anxiety    attack when her mom died  . Bronchitis 09/24/16   pt states mostly resolved but still has dry cough intermittently  . Complication of anesthesia    woke up during arm surgery-pain not controlled in PACU after arm surgery  . COPD (chronic obstructive pulmonary disease) (HCC)   . Cystocele 5/17   fitted with a #6 ring pessary with support in 6/17  . Family history of adverse reaction to anesthesia    son-agitated when coming out of anesthesia (he was only 69-31 years old)  . Hyperthyroidism    no meds  . Insomnia   . Migraine with aura   . Thyroid disease    Hyperthyroid     Social History   Social History  . Marital status:  Divorced    Spouse name: N/A  . Number of children: N/A  . Years of education: N/A   Occupational History  . Not on file.   Social History Main Topics  . Smoking status: Former Smoker    Packs/day: 0.50    Years: 30.00    Types: Cigarettes    Quit date: 10/15/2015  . Smokeless tobacco: Never Used  . Alcohol use No  . Drug use: No  . Sexual activity: Not Currently    Birth control/ protection: Condom   Other Topics Concern  . Not on file   Social History Narrative   Divorced.   3 children.   Works as a Sales promotion account executive.   Highest level of education: College.   Enjoys Spending time with her children, yard work.    Past Surgical History:  Procedure Laterality Date  . CHOLECYSTECTOMY    . ORIF DISTAL RADIUS FRACTURE Left 06/2011   Plate + Screws, Dr. Rosita Kea, [facility]  . REPAIR EXTENSOR TENDON Left 10/15/2016   Procedure: REPAIR EXTENSOR TENDON- EIP TO EPL TRANSFER-LEFT HAND;  Surgeon: Kennedy Bucker, MD;  Location: [facility] ORS;  Service: Orthopedics;  Laterality: Left;  . TUBAL LIGATION      Family History  Problem Relation Age of Onset  . Hyperlipidemia Mother   . Cancer Father        prostate  . Mental illness Father   .  Colon cancer Maternal Grandmother     No Known Allergies  Current Outpatient Prescriptions on File Prior to Visit  Medication Sig Dispense Refill  . albuterol (PROAIR HFA) 108 (90 Base) MCG/ACT inhaler INHALE 2 PUFFS INTO THE LUNGS EVERY 6 HOURS AS NEEDED FOR WHEEZING OR SHORTNESS OF BREATH 8.5 g 5  . albuterol (PROVENTIL HFA;VENTOLIN HFA) 108 (90 Base) MCG/ACT inhaler Inhale 2 puffs into the lungs every 6 (six) hours as needed for wheezing or shortness of breath. 1 Inhaler 2  . diphenhydrAMINE (BENADRYL) 25 MG tablet Take 25 mg by mouth daily as needed for itching or allergies.    . diphenhydramine-acetaminophen (TYLENOL PM) 25-500 MG TABS tablet Take 1 tablet by mouth at bedtime as needed (sleep).    . Melatonin 5 MG TABS Take 10 mg by mouth at  bedtime.    Marland Kitchen. nystatin cream (MYCOSTATIN) Apply 1 application topically 2 (two) times daily. Apply to affected area BID for up to 7 days. (Patient taking differently: Apply 1 application topically 2 (two) times daily as needed (rash). ) 30 g 0  . oxyCODONE (OXY IR/ROXICODONE) 5 MG immediate release tablet Take 1 tablet (5 mg total) by mouth once as needed (for pain score of 1-4). 30 tablet 0  . Tetrahydrozoline HCl (VISINE OP) Apply 1 drop to eye daily as needed (dry eyes).    . traMADol (ULTRAM) 50 MG tablet Take 50-100 mg by mouth 2 (two) times daily as needed for moderate pain.     No current facility-administered medications on file prior to visit.     BP 122/82   Pulse 72   Temp 98 F (36.7 C) (Oral)   Ht 5\' 4"  (1.626 m)   Wt 157 lb 6.4 oz (71.4 kg)   SpO2 98%   BMI 27.02 kg/m    Objective:   Physical Exam  Constitutional: She appears well-nourished. She does not appear ill.  HENT:  Right Ear: Tympanic membrane and ear canal normal.  Left Ear: Tympanic membrane and ear canal normal.  Nose: Right sinus exhibits no maxillary sinus tenderness and no frontal sinus tenderness. Left sinus exhibits no maxillary sinus tenderness and no frontal sinus tenderness.  Mouth/Throat: Oropharynx is clear and moist.  Eyes: Conjunctivae are normal.  Neck: Neck supple.  Cardiovascular: Normal rate and regular rhythm.   Pulmonary/Chest: Effort normal and breath sounds normal. She has no decreased breath sounds. She has no wheezes. She has no rales.  Lymphadenopathy:    She has no cervical adenopathy.  Skin: Skin is warm and dry.          Assessment & Plan:

## 2016-11-10 NOTE — Assessment & Plan Note (Signed)
Treated for acute bronchitis 2 weeks ago, overall better except for persistent cough. Strongly recommend she get online to apply for free Symbicort for daily use of COPD symptoms. She did get online during her visit today and was approved for this free annual coupon. She will start Symbicort today. Discussed that albuterol is not effective for daily symptoms. Rx for prednisone burst sent to pharmacy given SOB and intermittent wheezing.  She will update if no improvement .

## 2016-11-29 ENCOUNTER — Emergency Department (HOSPITAL_COMMUNITY)
Admission: EM | Admit: 2016-11-29 | Discharge: 2016-11-29 | Disposition: A | Discharge status: Home / Self Care | Payer: 59 | Attending: Emergency Medicine | Admitting: Emergency Medicine

## 2016-11-29 ENCOUNTER — Encounter (HOSPITAL_COMMUNITY): Payer: Self-pay

## 2016-11-29 ENCOUNTER — Emergency Department (HOSPITAL_COMMUNITY): Payer: 59

## 2016-11-29 DIAGNOSIS — Y92410 Unspecified street and highway as the place of occurrence of the external cause: Secondary | ICD-10-CM | POA: Diagnosis not present

## 2016-11-29 DIAGNOSIS — Z87891 Personal history of nicotine dependence: Secondary | ICD-10-CM | POA: Insufficient documentation

## 2016-11-29 DIAGNOSIS — S161XXA Strain of muscle, fascia and tendon at neck level, initial encounter: Secondary | ICD-10-CM | POA: Insufficient documentation

## 2016-11-29 DIAGNOSIS — J449 Chronic obstructive pulmonary disease, unspecified: Secondary | ICD-10-CM | POA: Insufficient documentation

## 2016-11-29 DIAGNOSIS — Y999 Unspecified external cause status: Secondary | ICD-10-CM | POA: Insufficient documentation

## 2016-11-29 DIAGNOSIS — R51 Headache: Secondary | ICD-10-CM | POA: Diagnosis not present

## 2016-11-29 DIAGNOSIS — R519 Headache, unspecified: Secondary | ICD-10-CM

## 2016-11-29 DIAGNOSIS — Z79899 Other long term (current) drug therapy: Secondary | ICD-10-CM | POA: Diagnosis not present

## 2016-11-29 DIAGNOSIS — Y939 Activity, unspecified: Secondary | ICD-10-CM | POA: Insufficient documentation

## 2016-11-29 DIAGNOSIS — S199XXA Unspecified injury of neck, initial encounter: Secondary | ICD-10-CM | POA: Diagnosis present

## 2016-11-29 MED ORDER — ACETAMINOPHEN 500 MG PO TABS
1000.0000 mg | ORAL_TABLET | Freq: Once | ORAL | Status: AC
  Administered 2016-11-29: 1000 mg via ORAL
  Filled 2016-11-29: qty 2
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
## 2016-11-29 NOTE — ED Notes (Signed)
Pt transported to CT ?
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
## 2016-12-08 ENCOUNTER — Ambulatory Visit: Payer: 59 | Attending: Orthopedic Surgery | Admitting: Occupational Therapy

## 2016-12-08 DIAGNOSIS — M25632 Stiffness of left wrist, not elsewhere classified: Secondary | ICD-10-CM | POA: Diagnosis present

## 2016-12-08 DIAGNOSIS — M6281 Muscle weakness (generalized): Secondary | ICD-10-CM | POA: Diagnosis present

## 2016-12-08 DIAGNOSIS — M25642 Stiffness of left hand, not elsewhere classified: Secondary | ICD-10-CM | POA: Diagnosis present

## 2016-12-08 DIAGNOSIS — L905 Scar conditions and fibrosis of skin: Secondary | ICD-10-CM | POA: Diagnosis present

## 2016-12-08 DIAGNOSIS — M79642 Pain in left hand: Secondary | ICD-10-CM

## 2016-12-08 NOTE — Therapy (Signed)
[facility] PHYSICAL AND SPORTS MEDICINE [birth year][address], [place], [address] Phone: [phone]   Fax:  [phone]  Occupational Therapy Evaluation  Patient Details  Name: Sonya Rodriguez MRN: [id number] Date of Birth: 02/25/1961 Referring Provider: Dr Rosita Kea   Encounter Date: 12/08/2016      OT End of Session - 12/08/16 1631    Visit Number 1   Number of Visits 12   Date for OT Re-Evaluation 01/19/17   OT Start Time 1305   OT Stop Time 1403   OT Time Calculation (min) 58 min   Activity Tolerance Patient tolerated treatment well   Behavior During Therapy [facility] for tasks assessed/performed      Past Medical History:  Diagnosis Date  . Anxiety    attack when her mom died  . Bronchitis 20-Sep-2016   pt states mostly resolved but still has dry cough intermittently  . Complication of anesthesia    woke up during arm surgery-pain not controlled in PACU after arm surgery  . COPD (chronic obstructive pulmonary disease) (HCC)   . Cystocele 5/17   fitted with a #6 ring pessary with support in 6/17  . Family history of adverse reaction to anesthesia    son-agitated when coming out of anesthesia (he was only 61-73 years old)  . Hyperthyroidism    no meds  . Insomnia   . Migraine with aura   . Thyroid disease    Hyperthyroid    Past Surgical History:  Procedure Laterality Date  . CHOLECYSTECTOMY    . ORIF DISTAL RADIUS FRACTURE Left 06/2011   Plate + Screws, Dr. Rosita Kea, [facility]  . REPAIR EXTENSOR TENDON Left 10/15/2016   Procedure: REPAIR EXTENSOR TENDON- EIP TO EPL TRANSFER-LEFT HAND;  Surgeon: Kennedy Bucker, MD;  Location: [facility] ORS;  Service: Orthopedics;  Laterality: Left;  . TUBAL LIGATION      There were no vitals filed for this visit.      Subjective Assessment - 12/08/16 1308    Subjective  Had Thumb pain more than 6 months - gradually worse - surgery on 6/27 - took brace - seen Dr Rosita Kea out of splint most all the time - refer to  therapy    Patient Stated Goals Want to get my thumb better to use it more - driving , fasten bra, open door, pulling or pushing , - 1/2 backto work only    Currently in Pain? 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, likes yard work , pets, kids 17 yrs old , Advertising account executive to music      AROM   Right Wrist Extension 63 Degrees   Right Wrist Flexion 85 Degrees   Left Wrist Extension 40 Degrees   Left Wrist Flexion 34 Degrees     Left Hand AROM   L Thumb MCP 0-60 -15 Degrees  L Thumb IP 0-80 -10 Degrees   L Thumb Radial ADduction/ABduction 0-55 60   L Thumb Palmar ADduction/ABduction 0-45 60   L Thumb Opposition to Index --  Base of 5th        Fluidotherapy done - doing ROM for wrist and digits ROM - to increase ROM and decrease pain   prior to review of HEP  Hand out provided  HEat  Scar massage and desentitization - massage, soft and rougher textures Wrist flexion , extention stretch - flexion with open and close hand  AROM wrist flexion and extention   Thumb AAROM and place and hold RA  IP extention on table place and hold  Rolling pen between fingers and thumb - maintain IP extention  AROM on table and away from cylinder object for thumb PA and RA  Tendon glides  Ice as needed at end                    OT Education - 12/08/16 1630    Education provided Yes   Education Details HEP , findings of eval    Person(s) Educated Patient   Methods Explanation;Demonstration;Tactile cues;Verbal cues;Handout   Comprehension Verbal  cues required;Returned demonstration;Verbalized understanding          OT Short Term Goals - 12/08/16 1635      OT SHORT TERM GOAL #1   Title L thumb AROM improve for pt to show AROM at thumb IP and MP extention WNL to get glove on , reach around glass , in pocket    Baseline IP and MP extention - 10 to -15 - can do PROM full - place and hold at these measurements   Time 3   Period Weeks   Status New   Target Date 12/29/16     OT SHORT TERM GOAL #2   Title L wrist AROM for flexion and extention improve with 20 degrees to use 75% at least L hand in ADL's    Baseline L wrist flexion 34, extention 40   Time 3   Period Weeks   Status New   Target Date 12/29/16     OT SHORT TERM GOAL #3   Title Pt to be independend in HEP to increase  L thumb and wrist ROM , decrease scar tissue to be able to massage , do rougher textures and tapping on scar with no symptoms     Baseline cannot tolerate massage, soft or rougher textures-    Time 4   Period Weeks   Status New   Target Date 01/05/17           OT Long Term Goals - 12/08/16 1640      OT LONG TERM GOAL #1   Title L thumb strength improve for pt to show 4/5 to type, push buttons , do buttons, pull and push with thumb in ADl's and IADl's    Baseline Cannot actively do full extention of thumb IP and MP - PA and RA 60 but not able to resist    Time 6   Period Weeks   Status New   Target Date 01/19/17     OT LONG TERM GOAL #2   Title Pain on PRWHE improve for L hand by at least 15 points    Baseline at eval pain on PRWHE 19/50  - mostly scar tissue ,tender and hyper sensitive    Time 4   Period Weeks   Status New   Target Date  01/05/17     OT LONG TERM GOAL #3   Title Function on PRWHE improve for pt with at least 20 points to use L hand with no symptoms    Baseline function on PRWHE at eval 38.5/50    Time 6   Period Weeks   Status New   Target Date 01/19/17               Plan - 12/08/16 1632    Clinical  Impression Statement Pt present about 8 wks s/p repair of L EPL using EIP - pt still sleeping with splint - pt show increase scar tissue,  adhesions, hyper sensitivity over dorsal 2 scars more than radial one - decrease wrist flexion , extention , thumb extention  and RA/PA - decrease strength  and increase pain - limiting her functional use of L hand in ADL's and IADL's    Occupational performance deficits (Please refer to evaluation for details): ADL's;IADL's;Work;Play;Leisure   Rehab Potential Good   OT Frequency 2x / week   OT Duration 6 weeks   OT Treatment/Interventions Self-care/ADL training;Fluidtherapy;Splinting;Patient/family education;Therapeutic exercises;Scar mobilization;Ultrasound;Cryotherapy;Passive range of motion;Manual Therapy;Parrafin   Plan assess progress in scar , huper sensitivity , ROM - use    Clinical Decision Making Several treatment options, min-mod task modification necessary   OT Home Exercise Plan see pt instructions   Consulted and Agree with Plan of Care Patient      Patient will benefit from skilled therapeutic intervention in order to improve the following deficits and impairments:  Impaired flexibility, Pain, Decreased scar mobility, Decreased coordination, Decreased range of motion, Decreased strength, Impaired UE functional use, Decreased knowledge of precautions  Visit Diagnosis: Scar condition and fibrosis of skin - Plan: Ot plan of care cert/re-cert  Stiffness of left hand, not elsewhere classified - Plan: Ot plan of care cert/re-cert  Muscle weakness (generalized) - Plan: Ot plan of care cert/re-cert  Pain in left hand - Plan: Ot plan of care cert/re-cert  Stiffness of left wrist, not elsewhere classified - Plan: Ot plan of care cert/re-cert    Problem List Patient Active Problem List   Diagnosis Date Noted  . Pain of left hand 06/25/2016  . Chronic obstructive pulmonary disease (HCC) 03/21/2016  . Cystocele   . Migraine with aura   .  Hyperthyroidism 11/20/2014  . Tobacco abuse 11/20/2014  . Migraines 11/20/2014    Oletta Cohn OTR/L,CLT 12/08/2016, 4:49 PM  [place] [facility] PHYSICAL AND SPORTS MEDICINE [address], [place], [address] Phone: [phone]   Fax:  [phone]  Name: Sonya Rodriguez MRN: [id number] Date of Birth: 30-Nov-1960

## 2016-12-08 NOTE — Patient Instructions (Addendum)
HEat  Scar massage and desentitization - massage, soft and rougher textures Wrist flexion , extention stretch - flexion with open and close hand  AROM wrist flexion and extention   Thumb AAROM and place and hold RA  IP extention on table place and hold  Rolling pen between fingers and thumb - maintain IP extention  AROM on table and away from cylinder object for thumb PA and RA  Tendon glides  Ice as needed at end

## 2016-12-11 ENCOUNTER — Ambulatory Visit: Payer: 59 | Admitting: Occupational Therapy

## 2016-12-11 DIAGNOSIS — M25642 Stiffness of left hand, not elsewhere classified: Secondary | ICD-10-CM

## 2016-12-11 DIAGNOSIS — L905 Scar conditions and fibrosis of skin: Secondary | ICD-10-CM | POA: Diagnosis not present

## 2016-12-11 DIAGNOSIS — M6281 Muscle weakness (generalized): Secondary | ICD-10-CM

## 2016-12-11 DIAGNOSIS — M79642 Pain in left hand: Secondary | ICD-10-CM

## 2016-12-11 DIAGNOSIS — M25632 Stiffness of left wrist, not elsewhere classified: Secondary | ICD-10-CM

## 2016-12-11 NOTE — Patient Instructions (Signed)
Add  But blocked middle phalanges on sides - able to extention IP with a lot of focus  Rolling pen between fingers and thumb - maintain IP extention  - pt thought it was more for fingers- pt to focus on thumb IP   Add and done isometric strengthening for PA and RA  Done and add thumb extention - with palm on table and lift thumb off table - a lot of focus but able

## 2016-12-11 NOTE — Therapy (Signed)
[place] [facility] [address] [place], [address] Phone: [phone]   Fax:  [phone]  Occupational Therapy Treatment  Patient Details  Name: Sonya Rodriguez MRN: [id number] Date of Birth: [birth year] Referring Provider: Dr Rosita Kea   Encounter Date: 12/11/2016      OT End of Session - 12/11/16 1705    Visit Number 2   Number of Visits 12   Date for OT Re-Evaluation 01/19/17   OT Start Time 1603   OT Stop Time 1651   OT Time Calculation (min) 48 min   Activity Tolerance Patient tolerated treatment well   Behavior During Therapy [facility] for tasks assessed/performed      Past Medical History:  Diagnosis Date  . Anxiety    attack when her mom died  . Bronchitis 2016-09-25   pt states mostly resolved but still has dry cough intermittently  . Complication of anesthesia    woke up during arm surgery-pain not controlled in PACU after arm surgery  . COPD (chronic obstructive pulmonary disease) (HCC)   . Cystocele 5/17   fitted with a #6 ring pessary with support in 6/17  . Family history of adverse reaction to anesthesia    son-agitated when coming out of anesthesia (he was only 39-49 years old)  . Hyperthyroidism    no meds  . Insomnia   . Migraine with aura   . Thyroid disease    Hyperthyroid    Past Surgical History:  Procedure Laterality Date  . CHOLECYSTECTOMY    . ORIF DISTAL RADIUS FRACTURE Left 06/2011   Plate + Screws, Dr. Rosita Kea, [facility]  . REPAIR EXTENSOR TENDON Left 10/15/2016   Procedure: REPAIR EXTENSOR TENDON- EIP TO EPL TRANSFER-LEFT HAND;  Surgeon: Kennedy Bucker, MD;  Location: [facility] ORS;  Service: Orthopedics;  Laterality: Left;  . TUBAL LIGATION      There were no vitals filed for this visit.      Subjective Assessment - 12/11/16 1659    Subjective  I was sore after last time - but I did the scar massage a lot - and it worked - it is much better -  I did not wear scar pad on knuckle -  splint did not cover it - motion is better too    Patient Stated Goals Want to get my thumb better to use it more - driving , fasten bra, open door, pulling or pushing , - 1/2 backto work only    Currently in Pain? No/denies            [facility] OT Assessment - 12/11/16 0001      AROM   Left Wrist Extension 62 Degrees  after fluido   Left Wrist Flexion 65 Degrees  after fluido                  OT Treatments/Exercises (OP) - 12/11/16 0001      LUE Fluidotherapy   Number Minutes Fluidotherapy 10 Minutes   LUE Fluidotherapy Location Hand;Wrist   Comments At [facility] to increase ROM and thumb and wrist - decrease pain       luido therapy  Scar massage and desentitization  Done this date with massage , xtractor and vibration - with AROM of thumb flexion and extention  Checked all 3 scars - still tender but much better - can still not tolerate tapping , rougher textures and to much vibration   Wrist flexion , extention stretch - flexion with  open and close hand  AROM wrist flexion and extention   Thumb AROM for PA and  RA - full with IP extention   but mid range able to only do place and hold IP extention  But blocked middle phalanges on sides - able to extention IP with a lot of focus  Rolling pen between fingers and thumb - maintain IP extention  - pt thought it was more for fingers- pt to focus on thumb IP  AROM on table and away from cylinder object for thumb PA and RA - full extention  Add and done isometric strengthening for PA and RA Tendon glides  Done and add thumb extention - with palm on table and lift thumb off table - a lot of focus but able             OT Education - 12/11/16 1705    Education provided Yes   Education Details HEP update   Person(s) Educated Patient;Child(ren)   Methods Explanation;Demonstration;Tactile cues;Verbal cues;Handout   Comprehension Verbal cues required;Returned demonstration;Verbalized understanding          OT Short  Term Goals - 12/08/16 1635      OT SHORT TERM GOAL #1   Title L thumb AROM improve for pt to show AROM at thumb IP and MP extention WNL to get glove on , reach around glass , in pocket    Baseline IP and MP extention - 10 to -15 - can do PROM full - place and hold at these measurements   Time 3   Period Weeks   Status New   Target Date 12/29/16     OT SHORT TERM GOAL #2   Title L wrist AROM for flexion and extention improve with 20 degrees to use 75% at least L hand in ADL's    Baseline L wrist flexion 34, extention 40   Time 3   Period Weeks   Status New   Target Date 12/29/16     OT SHORT TERM GOAL #3   Title Pt to be independend in HEP to increase  L thumb and wrist ROM , decrease scar tissue to be able to massage , do rougher textures and tapping on scar with no symptoms     Baseline cannot tolerate massage, soft or rougher textures-    Time 4   Period Weeks   Status New   Target Date 01/05/17           OT Long Term Goals - 12/08/16 1640      OT LONG TERM GOAL #1   Title L thumb strength improve for pt to show 4/5 to type, push buttons , do buttons, pull and push with thumb in ADl's and IADl's    Baseline Cannot actively do full extention of thumb IP and MP - PA and RA 60 but not able to resist    Time 6   Period Weeks   Status New   Target Date 01/19/17     OT LONG TERM GOAL #2   Title Pain on PRWHE improve for L hand by at least 15 points    Baseline at eval pain on PRWHE 19/50  - mostly scar tissue ,tender and hyper sensitive    Time 4   Period Weeks   Status New   Target Date 01/05/17     OT LONG TERM GOAL #3   Title Function on PRWHE improve for pt with at least 20 points to use L hand with  no symptoms    Baseline function on PRWHE at eval 38.5/50    Time 6   Period Weeks   Status New   Target Date 01/19/17               Plan - 12/11/16 1706    Clinical Impression Statement Pt made progress in ROM in thumb , wrist since earlier this week -  scar improved greatly - pt to cont with scar mobs , desentitzation , ROM and strength initiated this date    Occupational performance deficits (Please refer to evaluation for details): ADL's;IADL's;Work;Play;Leisure   Rehab Potential Good   OT Frequency 2x / week   OT Duration 6 weeks   OT Treatment/Interventions Self-care/ADL training;Fluidtherapy;Splinting;Patient/family education;Therapeutic exercises;Scar mobilization;Ultrasound;Cryotherapy;Passive range of motion;Manual Therapy;Parrafin   Plan assess progress with HEP    Clinical Decision Making Several treatment options, min-mod task modification necessary   OT Home Exercise Plan see pt instructions   Consulted and Agree with Plan of Care Patient      Patient will benefit from skilled therapeutic intervention in order to improve the following deficits and impairments:  Impaired flexibility, Pain, Decreased scar mobility, Decreased coordination, Decreased range of motion, Decreased strength, Impaired UE functional use, Decreased knowledge of precautions  Visit Diagnosis: Scar condition and fibrosis of skin  Stiffness of left hand, not elsewhere classified  Muscle weakness (generalized)  Pain in left hand  Stiffness of left wrist, not elsewhere classified    Problem List Patient Active Problem List   Diagnosis Date Noted  . Pain of left hand 06/25/2016  . Chronic obstructive pulmonary disease (HCC) 03/21/2016  . Cystocele   . Migraine with aura   . Hyperthyroidism 11/20/2014  . Tobacco abuse 11/20/2014  . Migraines 11/20/2014    Oletta Cohn OTR/L;CLT 12/11/2016, 5:08 PM  [place] [facility]IC[facility] [address] [place], [address] Phone: [phone]   Fax:  [phone]  Name: Sonya Rodriguez MRN: [id number] Date of Birth: Dec 29, 1960

## 2016-12-15 ENCOUNTER — Encounter: Payer: Self-pay | Admitting: Primary Care

## 2016-12-15 ENCOUNTER — Ambulatory Visit: Payer: 59 | Admitting: Occupational Therapy

## 2016-12-15 DIAGNOSIS — M6281 Muscle weakness (generalized): Secondary | ICD-10-CM

## 2016-12-15 DIAGNOSIS — L905 Scar conditions and fibrosis of skin: Secondary | ICD-10-CM | POA: Diagnosis not present

## 2016-12-15 DIAGNOSIS — M79642 Pain in left hand: Secondary | ICD-10-CM

## 2016-12-15 DIAGNOSIS — M25642 Stiffness of left hand, not elsewhere classified: Secondary | ICD-10-CM

## 2016-12-15 DIAGNOSIS — M25632 Stiffness of left wrist, not elsewhere classified: Secondary | ICD-10-CM

## 2016-12-15 NOTE — Telephone Encounter (Signed)
Please notify patient's mother that I've not seen the patient in nearly 2 years. Can only authorize 1 refill of three months supply until she's seen in our office. Please sent it in to whichever [facility] she's referring to. Please schedule her for re-evaluation, we can do a full physical if she's agreeable.

## 2016-12-15 NOTE — Patient Instructions (Addendum)
Same HEP but focus on scar massage at dorsal wrist with wrist and hand in composite flexion  Upgrade to rubber band for thumb PA and RA - full extention     kinesiotape done with 20% pull x2  parallel and 100 % across x 1  Ed on doing at home - but shorter ones and only keep on 2-3 hrs during day - cica scar pad for night time - should not increase pain

## 2016-12-15 NOTE — Telephone Encounter (Signed)
Message left for patient to return my call.  

## 2016-12-15 NOTE — Therapy (Signed)
[facility] [facility] [address] [place], [address] Phone: [phone]   Fax:  [phone]  Occupational Therapy Treatment  Patient Details  Name: Leonor Darnell MRN: [id number] Date of Birth: [birth year] Referring Provider: Dr Rosita Kea   Encounter Date: 12/15/2016      OT End of Session - 12/15/16 1930    Visit Number 3   Number of Visits 12   Date for OT Re-Evaluation 01/19/17   OT Start Time 1302   OT Stop Time 1343   OT Time Calculation (min) 41 min   Activity Tolerance Patient tolerated treatment well   Behavior During Therapy [facility] for tasks assessed/performed      Past Medical History:  Diagnosis Date  . Anxiety    attack when her mom died  . Bronchitis 09/15/2016   pt states mostly resolved but still has dry cough intermittently  . Complication of anesthesia    woke up during arm surgery-pain not controlled in PACU after arm surgery  . COPD (chronic obstructive pulmonary disease) (HCC)   . Cystocele 5/17   fitted with a #6 ring pessary with support in 6/17  . Family history of adverse reaction to anesthesia    son-agitated when coming out of anesthesia (he was only 37-20 years old)  . Hyperthyroidism    no meds  . Insomnia   . Migraine with aura   . Thyroid disease    Hyperthyroid    Past Surgical History:  Procedure Laterality Date  . CHOLECYSTECTOMY    . ORIF DISTAL RADIUS FRACTURE Left 06/2011   Plate + Screws, Dr. Rosita Kea, [facility]  . REPAIR EXTENSOR TENDON Left 10/15/2016   Procedure: REPAIR EXTENSOR TENDON- EIP TO EPL TRANSFER-LEFT HAND;  Surgeon: Kennedy Bucker, MD;  Location: [facility] ORS;  Service: Orthopedics;  Laterality: Left;  . TUBAL LIGATION      There were no vitals filed for this visit.      Subjective Assessment - 12/15/16 1844    Subjective  --   Patient Stated Goals Want to get my thumb better to use it more - driving , fasten bra, open door, pulling or pushing , - 1/2 backto  work only    Currently in Pain? --   Pain Score --   Pain Location --   Pain Orientation --   Pain Descriptors / Indicators --   Pain Type --                      OT Treatments/Exercises (OP) - 12/15/16 0001      Ultrasound   Ultrasound Location scar dorsal wrist    Ultrasound Parameters 3. , 20%, .9 intentisy for 3 min    Ultrasound Goals Pain;Edema     LUE Paraffin   Number Minutes Paraffin 10 Minutes   LUE Paraffin Location Hand;Wrist   Comments to decrease scar tissue - last 5 min hand in fist in combination with wrist flexion       Pt show still scar adhesion on dorsal wrist with wrist in flexion and digits Paraffin done to decrease scar tissue - pt report she did not use heat prior to HEP   Scar massage and desentitization  Done this date with massage , xtractor( with AROM of wrist )  - with AROM of thumb flexion and extention  Checked all 3 scars - still tender but much better - can still not tolerate tapping , rougher textures and vibration  Wrist flexion , extention stretch - flexion with open and close hand - focus composite flexion  AROM wrist flexion and extention  Thumb AROM for PA and  RA - full with IP extention    cont with  blocked middle phalanges on sides - able to extention IP with a lot of focus  Rolling pen between fingers and thumb - maintain IP extention  - pt thought it was more for fingers- pt to focus on thumb IP  Upgrade to rubber band for thumb PA and RA - full extention   Tendon glides  Done and add thumb extention - with palm on table and lift thumb off table - a lot of focus but able  kinesiotape done with 20% pull x2  parallel and 100 % across x 1  Ed on doing at home - but shorter ones and only keep on 2-3 hrs during day - cica scar pad for night time - should not increase pain              OT Education - 12/15/16 1930    Education provided Yes   Education Details kinesiotape and scar mobs    Person(s)  Educated Patient;Child(ren)   Methods Explanation;Demonstration;Tactile cues;Verbal cues   Comprehension Verbal cues required;Returned demonstration;Verbalized understanding          OT Short Term Goals - 12/08/16 1635      OT SHORT TERM GOAL #1   Title L thumb AROM improve for pt to show AROM at thumb IP and MP extention WNL to get glove on , reach around glass , in pocket    Baseline IP and MP extention - 10 to -15 - can do PROM full - place and hold at these measurements   Time 3   Period Weeks   Status New   Target Date 12/29/16     OT SHORT TERM GOAL #2   Title L wrist AROM for flexion and extention improve with 20 degrees to use 75% at least L hand in ADL's    Baseline L wrist flexion 34, extention 40   Time 3   Period Weeks   Status New   Target Date 12/29/16     OT SHORT TERM GOAL #3   Title Pt to be independend in HEP to increase  L thumb and wrist ROM , decrease scar tissue to be able to massage , do rougher textures and tapping on scar with no symptoms     Baseline cannot tolerate massage, soft or rougher textures-    Time 4   Period Weeks   Status New   Target Date 01/05/17           OT Long Term Goals - 12/08/16 1640      OT LONG TERM GOAL #1   Title L thumb strength improve for pt to show 4/5 to type, push buttons , do buttons, pull and push with thumb in ADl's and IADl's    Baseline Cannot actively do full extention of thumb IP and MP - PA and RA 60 but not able to resist    Time 6   Period Weeks   Status New   Target Date 01/19/17     OT LONG TERM GOAL #2   Title Pain on PRWHE improve for L hand by at least 15 points    Baseline at eval pain on PRWHE 19/50  - mostly scar tissue ,tender and hyper sensitive    Time 4   Period  Weeks   Status New   Target Date 01/05/17     OT LONG TERM GOAL #3   Title Function on PRWHE improve for pt with at least 20 points to use L hand with no symptoms    Baseline function on PRWHE at eval 38.5/50    Time 6    Period Weeks   Status New   Target Date 01/19/17               Plan - 12/15/16 1931    Clinical Impression Statement Thumb AROM and strength improving - still compensating with PA and RA for IP extention - but improving - scar on dorsal wrist improving but still adhere - cont to scar mobs with wrist and digits in flexion    Occupational performance deficits (Please refer to evaluation for details): ADL's;IADL's;Work;Play;Leisure   Rehab Potential Good   OT Frequency 2x / week   OT Duration 6 weeks   OT Treatment/Interventions Self-care/ADL training;Fluidtherapy;Splinting;Patient/family education;Therapeutic exercises;Scar mobilization;Ultrasound;Cryotherapy;Passive range of motion;Manual Therapy;Parrafin   Plan scar assess - grip and prehension assess    Clinical Decision Making Several treatment options, min-mod task modification necessary   OT Home Exercise Plan see pt instructions   Consulted and Agree with Plan of Care Patient      Patient will benefit from skilled therapeutic intervention in order to improve the following deficits and impairments:  Impaired flexibility, Pain, Decreased scar mobility, Decreased coordination, Decreased range of motion, Decreased strength, Impaired UE functional use, Decreased knowledge of precautions  Visit Diagnosis: Scar condition and fibrosis of skin  Stiffness of left hand, not elsewhere classified  Muscle weakness (generalized)  Pain in left hand  Stiffness of left wrist, not elsewhere classified    Problem List Patient Active Problem List   Diagnosis Date Noted  . Pain of left hand 06/25/2016  . Chronic obstructive pulmonary disease (HCC) 03/21/2016  . Cystocele   . Migraine with aura   . Hyperthyroidism 11/20/2014  . Tobacco abuse 11/20/2014  . Migraines 11/20/2014    Oletta Cohn OTR/L,CLT 12/15/2016, 7:33 PM  [facility] PHYSICAL AND SPORTS MEDICINE [address] [place], [address] Phone: [phone]   Fax:  [phone]  Name: Rye Dwelle MRN: [id number] Date of Birth: 1960/05/08

## 2016-12-16 NOTE — Telephone Encounter (Signed)
Spoken and notified patient of Kate's comments. Patient verbalized understanding. 

## 2016-12-18 ENCOUNTER — Ambulatory Visit: Payer: 59 | Admitting: Occupational Therapy

## 2016-12-18 DIAGNOSIS — M25632 Stiffness of left wrist, not elsewhere classified: Secondary | ICD-10-CM

## 2016-12-18 DIAGNOSIS — M25642 Stiffness of left hand, not elsewhere classified: Secondary | ICD-10-CM

## 2016-12-18 DIAGNOSIS — M79642 Pain in left hand: Secondary | ICD-10-CM

## 2016-12-18 DIAGNOSIS — M6281 Muscle weakness (generalized): Secondary | ICD-10-CM

## 2016-12-18 DIAGNOSIS — L905 Scar conditions and fibrosis of skin: Secondary | ICD-10-CM

## 2016-12-18 NOTE — Therapy (Signed)
[facility] [address] [place], [address] Phone: [phone]   Fax:  [phone]  Occupational Therapy Treatment  Patient Details  Name: Sonya Rodriguez MRN: [id number] Date of Birth: 03-Feb-1961 Referring Provider: Dr Rosita Kea   Encounter Date: 12/18/2016      OT End of Session - 12/18/16 1612    Visit Number 4   Number of Visits 12   Date for OT Re-Evaluation 01/19/17   OT Start Time 1305   OT Stop Time 1346   OT Time Calculation (min) 41 min   Activity Tolerance Patient tolerated treatment well   Behavior During Therapy [facility] for tasks assessed/performed      Past Medical History:  Diagnosis Date  . Anxiety    attack when her mom died  . Bronchitis 21-Sep-2016   pt states mostly resolved but still has dry cough intermittently  . Complication of anesthesia    woke up during arm surgery-pain not controlled in PACU after arm surgery  . COPD (chronic obstructive pulmonary disease) (HCC)   . Cystocele 5/17   fitted with a #6 ring pessary with support in 6/17  . Family history of adverse reaction to anesthesia    son-agitated when coming out of anesthesia (he was only 38-51 years old)  . Hyperthyroidism    no meds  . Insomnia   . Migraine with aura   . Thyroid disease    Hyperthyroid    Past Surgical History:  Procedure Laterality Date  . CHOLECYSTECTOMY    . ORIF DISTAL RADIUS FRACTURE Left 06/2011   Plate + Screws, Dr. Rosita Kea, [facility]  . REPAIR EXTENSOR TENDON Left 10/15/2016   Procedure: REPAIR EXTENSOR TENDON- EIP TO EPL TRANSFER-LEFT HAND;  Surgeon: Kennedy Bucker, MD;  Location: [facility] ORS;  Service: Orthopedics;  Laterality: Left;  . TUBAL LIGATION      There were no vitals filed for this visit.      Subjective Assessment - 12/18/16 1604    Subjective  I did the exercises and work that scar at wrist and  did the tape - the wrist hurts about 4/10 - I still protecting my hand /thumb - like  reaching for doorknob - do not trust it yet    Patient Stated Goals Want to get my thumb better to use it more - driving , fasten bra, open door, pulling or pushing , - 1/2 backto work only    Currently in Pain? Yes   Pain Score 4    Pain Location Wrist   Pain Orientation Left   Pain Descriptors / Indicators Aching            [facility] OT Assessment - 12/18/16 0001      AROM   Left Wrist Flexion 72 Degrees     Strength   Right Hand Grip (lbs) 55   Right Hand Lateral Pinch 15 lbs   Right Hand 3 Point Pinch 15 lbs   Left Hand Grip (lbs) 40   Left Hand Lateral Pinch 13 lbs   Left Hand 3 Point Pinch 11 lbs                  OT Treatments/Exercises (OP) - 12/18/16 0001      LUE Paraffin   Number Minutes Paraffin 10 Minutes   LUE Paraffin Location Hand;Wrist   Comments decreas pain at wrist       Pt report achiness at wrist scar  Assess grip and  prehension strength  Thumb extentoin IP - still compensate with MP and CMC to do IP extention   Teal putty for gripping , lat and 3 point grip  10-15 reps  2 x day  16 oz hammer for wrist for sup /pro, RD and UD ,  10 reps  With wrist flexion and extention - had some clicking at forearm /wrist dorsal scar - but not painfull  10 reps  To do at home  Add dexterity HEP - and done in clinic  THUMB <> fingers on cards , shuffle  Pick up small objects 2-5 - with 2 point pinch - put in palm and release out of palm same way 2 point pinch  Walk up and down pen with 3 point pinch  Golf ball palm <> fingers tips  Using IP flexion and extention with all  KEEP pain under 2/10              OT Education - 12/18/16 1612    Education provided Yes   Education Details HEP update    Person(s) Educated Patient;Child(ren)   Methods Explanation;Demonstration;Tactile cues;Verbal cues;Handout   Comprehension Returned demonstration;Verbalized understanding;Verbal cues required          OT Short Term Goals - 12/08/16 1635       OT SHORT TERM GOAL #1   Title L thumb AROM improve for pt to show AROM at thumb IP and MP extention WNL to get glove on , reach around glass , in pocket    Baseline IP and MP extention - 10 to -15 - can do PROM full - place and hold at these measurements   Time 3   Period Weeks   Status New   Target Date 12/29/16     OT SHORT TERM GOAL #2   Title L wrist AROM for flexion and extention improve with 20 degrees to use 75% at least L hand in ADL's    Baseline L wrist flexion 34, extention 40   Time 3   Period Weeks   Status New   Target Date 12/29/16     OT SHORT TERM GOAL #3   Title Pt to be independend in HEP to increase  L thumb and wrist ROM , decrease scar tissue to be able to massage , do rougher textures and tapping on scar with no symptoms     Baseline cannot tolerate massage, soft or rougher textures-    Time 4   Period Weeks   Status New   Target Date 01/05/17           OT Long Term Goals - 12/08/16 1640      OT LONG TERM GOAL #1   Title L thumb strength improve for pt to show 4/5 to type, push buttons , do buttons, pull and push with thumb in ADl's and IADl's    Baseline Cannot actively do full extention of thumb IP and MP - PA and RA 60 but not able to resist    Time 6   Period Weeks   Status New   Target Date 01/19/17     OT LONG TERM GOAL #2   Title Pain on PRWHE improve for L hand by at least 15 points    Baseline at eval pain on PRWHE 19/50  - mostly scar tissue ,tender and hyper sensitive    Time 4   Period Weeks   Status New   Target Date 01/05/17     OT LONG TERM GOAL #  3   Title Function on PRWHE improve for pt with at least 20 points to use L hand with no symptoms    Baseline function on PRWHE at eval 38.5/50    Time 6   Period Weeks   Status New   Target Date 01/19/17               Plan - 12/18/16 1612    Clinical Impression Statement Pt cont to compensate with proximal joints for IP extention - can do it but consentrate - scar  still adhere at dorsal wrist - but had some pain about 4/10 - pt to stop for few days and work on strength and dexterity ( using thumb I Pflexion and extenton )  - tolerate upgrade of HEP well    Occupational performance deficits (Please refer to evaluation for details): ADL's;IADL's;Work;Play;Leisure   Rehab Potential Good   OT Frequency 2x / week   OT Duration 4 weeks   OT Treatment/Interventions Self-care/ADL training;Fluidtherapy;Splinting;Patient/family education;Therapeutic exercises;Scar mobilization;Ultrasound;Cryotherapy;Passive range of motion;Manual Therapy;Parrafin   Plan assess progress and tolerance for strengthenign and Dexterity    Clinical Decision Making Several treatment options, min-mod task modification necessary   OT Home Exercise Plan see pt instructions   Consulted and Agree with Plan of Care Patient      Patient will benefit from skilled therapeutic intervention in order to improve the following deficits and impairments:  Impaired flexibility, Pain, Decreased scar mobility, Decreased coordination, Decreased range of motion, Decreased strength, Impaired UE functional use, Decreased knowledge of precautions  Visit Diagnosis: Scar condition and fibrosis of skin  Stiffness of left hand, not elsewhere classified  Muscle weakness (generalized)  Pain in left hand  Stiffness of left wrist, not elsewhere classified    Problem List Patient Active Problem List   Diagnosis Date Noted  . Pain of left hand 06/25/2016  . Chronic obstructive pulmonary disease (HCC) 03/21/2016  . Cystocele   . Migraine with aura   . Hyperthyroidism 11/20/2014  . Tobacco abuse 11/20/2014  . Migraines 11/20/2014    Oletta CohnuPreez, Derrin Currey OTR/L,CLT 12/18/2016, 4:15 PM  [facility] [address] [place], [place], [address] Phone: [phone]   Fax:  [phone]  Name: Sonya Rodriguez MRN: [id number] Date of Birth:  02/21/1961

## 2016-12-18 NOTE — Patient Instructions (Signed)
ADD to HEP  Teal putty for gripping , lat and 3 point grip  10-15 reps  2 x day  16 oz hammer for wrist for sup /pro, RD and UD ,  10 reps  With wrist flexion and extention 10 reps  To do at home  Add dexterity HEP   THUMB <> fingers on cards , shuffle  Pick up small objects 2-5 - with 2 point pinch - put in palm and release out of palm same way 2 point pinch  Walk up and down pen with 3 point pinch  Golf ball palm <> fingers tips  Using IP flexion and extention with all  KEEP pain under 2/10  Stop scar massage for 2 days

## 2016-12-25 ENCOUNTER — Ambulatory Visit: Payer: 59 | Attending: Orthopedic Surgery | Admitting: Occupational Therapy

## 2016-12-25 DIAGNOSIS — M79642 Pain in left hand: Secondary | ICD-10-CM

## 2016-12-25 DIAGNOSIS — M6281 Muscle weakness (generalized): Secondary | ICD-10-CM

## 2016-12-25 DIAGNOSIS — M25642 Stiffness of left hand, not elsewhere classified: Secondary | ICD-10-CM

## 2016-12-25 DIAGNOSIS — M25632 Stiffness of left wrist, not elsewhere classified: Secondary | ICD-10-CM | POA: Diagnosis present

## 2016-12-25 DIAGNOSIS — L905 Scar conditions and fibrosis of skin: Secondary | ICD-10-CM

## 2016-12-25 NOTE — Therapy (Signed)
[place] [facility] [address], [place], [address] Phone: [phone]   Fax:  [phone]  Occupational Therapy Treatment  Patient Details  Name: Sonya Rodriguez MRN: [id number] Date of Birth: [birth year] Referring Provider: Dr Rosita Kea   Encounter Date: 12/25/2016      OT End of Session - 12/25/16 1833    Visit Number 5   Number of Visits 12   Date for OT Re-Evaluation 01/19/17   OT Start Time 1514   OT Stop Time 1613   OT Time Calculation (min) 59 min   Activity Tolerance Patient tolerated treatment well   Behavior During Therapy [facility] for tasks assessed/performed      Past Medical History:  Diagnosis Date  . Anxiety    attack when her mom died  . Bronchitis 09/02/2016   pt states mostly resolved but still has dry cough intermittently  . Complication of anesthesia    woke up during arm surgery-pain not controlled in PACU after arm surgery  . COPD (chronic obstructive pulmonary disease) (HCC)   . Cystocele 5/17   fitted with a #6 ring pessary with support in 6/17  . Family history of adverse reaction to anesthesia    son-agitated when coming out of anesthesia (he was only 35-64 years old)  . Hyperthyroidism    no meds  . Insomnia   . Migraine with aura   . Thyroid disease    Hyperthyroid    Past Surgical History:  Procedure Laterality Date  . CHOLECYSTECTOMY    . ORIF DISTAL RADIUS FRACTURE Left 06/2011   Plate + Screws, Dr. Rosita Kea, [facility]  . REPAIR EXTENSOR TENDON Left 10/15/2016   Procedure: REPAIR EXTENSOR TENDON- EIP TO EPL TRANSFER-LEFT HAND;  Surgeon: Kennedy Bucker, MD;  Location: [facility];  Service: Orthopedics;  Laterality: Left;  . TUBAL LIGATION      There were no vitals filed for this visit.      Subjective Assessment - 12/25/16 1830    Subjective  My scars keeps hurting - but I always pull them up and working them - constantly - did do the exercises    Patient Stated Goals Want to get  my thumb better to use it more - driving , fasten bra, open door, pulling or pushing , - 1/2 backto work only    Currently in Pain? Yes   Pain Score 3    Pain Location Hand   Pain Orientation Left   Pain Descriptors / Indicators Aching   Pain Type Surgical pain                      OT Treatments/Exercises (OP) - 12/25/16 0001      LUE Fluidotherapy   Number Minutes Fluidotherapy 10 Minutes   LUE Fluidotherapy Location Hand;Wrist   Comments at [facility] to decrease pain - and increaese thumb extention       Pt report achiness at wrist scar - but pt rubbing and pulling it up constantly - pt to only do scar massage and mobs 3 x day  Assess grip and prehension strength - progress well  Thumb extentoin IP - during PA and RA still hard time extending IP  Review again rubber band for PA and RA - 10 reps each     Green putty for gripping , lat and 3 point grip  10-15 reps  2 x day  10 reps    Review  dexterity HEP -  reinforce for pt no only for dexterity - but thumb IP - pt during functional task show AROM IP extention  THUMB <> fingers on cards , shuffle  Pick up small objects 2-5 - with 2 point pinch - put in palm and release out of palm same way 2 point pinch  Walk up and down pen with 3 point pinch  Chines ball  palm <> fingers tips  Using IP flexion and extention with all  KEEP pain under 2/10             OT Education - 12/25/16 1832    Education provided Yes   Education Details HEP reinforce    Person(s) Educated Patient;Child(ren)   Methods Explanation;Demonstration;Tactile cues;Verbal cues   Comprehension Verbal cues required;Returned demonstration;Verbalized understanding          OT Short Term Goals - 12/08/16 1635      OT SHORT TERM GOAL #1   Title L thumb AROM improve for pt to show AROM at thumb IP and MP extention WNL to get glove on , reach around glass , in pocket    Baseline IP and MP extention - 10 to -15 - can do PROM full - place and  hold at these measurements   Time 3   Period Weeks   Status New   Target Date 12/29/16     OT SHORT TERM GOAL #2   Title L wrist AROM for flexion and extention improve with 20 degrees to use 75% at least L hand in ADL's    Baseline L wrist flexion 34, extention 40   Time 3   Period Weeks   Status New   Target Date 12/29/16     OT SHORT TERM GOAL #3   Title Pt to be independend in HEP to increase  L thumb and wrist ROM , decrease scar tissue to be able to massage , do rougher textures and tapping on scar with no symptoms     Baseline cannot tolerate massage, soft or rougher textures-    Time 4   Period Weeks   Status New   Target Date 01/05/17           OT Long Term Goals - 12/08/16 1640      OT LONG TERM GOAL #1   Title L thumb strength improve for pt to show 4/5 to type, push buttons , do buttons, pull and push with thumb in ADl's and IADl's    Baseline Cannot actively do full extention of thumb IP and MP - PA and RA 60 but not able to resist    Time 6   Period Weeks   Status New   Target Date 01/19/17     OT LONG TERM GOAL #2   Title Pain on PRWHE improve for L hand by at least 15 points    Baseline at eval pain on PRWHE 19/50  - mostly scar tissue ,tender and hyper sensitive    Time 4   Period Weeks   Status New   Target Date 01/05/17     OT LONG TERM GOAL #3   Title Function on PRWHE improve for pt with at least 20 points to use L hand with no symptoms    Baseline function on PRWHE at eval 38.5/50    Time 6   Period Weeks   Status New   Target Date 01/19/17               Plan - 12/25/16 1834  Clinical Impression Statement Reinforce again for pt to only do HEP 2-3 x day and scar mobs and massage no over do it - her pain is 3-4/10 at rest - pt to do exercises slower and not as intense - she show full extention of IP during functional tasks but with ther ex  and thumb PA and RA pt has hard time extending IP -     Occupational performance deficits  (Please refer to evaluation for details): ADL's;IADL's;Work;Play;Leisure   Rehab Potential Good   OT Frequency 2x / week   OT Duration 4 weeks   OT Treatment/Interventions Self-care/ADL training;Fluidtherapy;Splinting;Patient/family education;Therapeutic exercises;Scar mobilization;Ultrasound;Cryotherapy;Passive range of motion;Manual Therapy;Parrafin   Plan cont to increase IP exention of thumb and strength    Clinical Decision Making Several treatment options, min-mod task modification necessary   OT Home Exercise Plan see pt instructions   Consulted and Agree with Plan of Care Patient      Patient will benefit from skilled therapeutic intervention in order to improve the following deficits and impairments:  Impaired flexibility, Pain, Decreased scar mobility, Decreased coordination, Decreased range of motion, Decreased strength, Impaired UE functional use, Decreased knowledge of precautions  Visit Diagnosis: Scar condition and fibrosis of skin  Stiffness of left hand, not elsewhere classified  Muscle weakness (generalized)  Pain in left hand  Stiffness of left wrist, not elsewhere classified    Problem List Patient Active Problem List   Diagnosis Date Noted  . Pain of left hand 06/25/2016  . Chronic obstructive pulmonary disease (HCC) 03/21/2016  . Cystocele   . Migraine with aura   . Hyperthyroidism 11/20/2014  . Tobacco abuse 11/20/2014  . Migraines 11/20/2014    Oletta CohnuPreez, Primus Gritton OTR/L,CLT 12/25/2016, 6:37 PM  [place] [facility] [address] [place], [place], [address] Phone: [phone]   Fax:  [phone]  Name: Blair Heysnne Marie Milley MRN: [id number] Date of Birth: 06/26/1960

## 2016-12-29 ENCOUNTER — Ambulatory Visit: Payer: 59 | Admitting: Occupational Therapy

## 2016-12-29 DIAGNOSIS — L905 Scar conditions and fibrosis of skin: Secondary | ICD-10-CM

## 2016-12-29 DIAGNOSIS — M79642 Pain in left hand: Secondary | ICD-10-CM

## 2016-12-29 DIAGNOSIS — M25632 Stiffness of left wrist, not elsewhere classified: Secondary | ICD-10-CM

## 2016-12-29 DIAGNOSIS — M25642 Stiffness of left hand, not elsewhere classified: Secondary | ICD-10-CM

## 2016-12-29 DIAGNOSIS — M6281 Muscle weakness (generalized): Secondary | ICD-10-CM

## 2016-12-29 NOTE — Therapy (Signed)
[place] [facility] [address], [place], [address] Phone: [phone]   Fax:  [phone]  Occupational Therapy Treatment  Patient Details  Name: Dellene Mcgroarty MRN: [id number] Date of Birth: [birth year] Referring Provider: Dr Rosita Kea   Encounter Date: 12/29/2016      OT End of Session - 12/29/16 1820    Visit Number 6   Number of Visits 12   Date for OT Re-Evaluation 01/19/17   OT Start Time 1448   OT Stop Time 1535   OT Time Calculation (min) 47 min   Activity Tolerance Patient tolerated treatment well   Behavior During Therapy [facility] for tasks assessed/performed      Past Medical History:  Diagnosis Date  . Anxiety    attack when her mom died  . Bronchitis 2016/10/10   pt states mostly resolved but still has dry cough intermittently  . Complication of anesthesia    woke up during arm surgery-pain not controlled in PACU after arm surgery  . COPD (chronic obstructive pulmonary disease) (HCC)   . Cystocele 5/17   fitted with a #6 ring pessary with support in 6/17  . Family history of adverse reaction to anesthesia    son-agitated when coming out of anesthesia (he was only 102-63 years old)  . Hyperthyroidism    no meds  . Insomnia   . Migraine with aura   . Thyroid disease    Hyperthyroid    Past Surgical History:  Procedure Laterality Date  . CHOLECYSTECTOMY    . ORIF DISTAL RADIUS FRACTURE Left 06/2011   Plate + Screws, Dr. Rosita Kea, [facility]  . REPAIR EXTENSOR TENDON Left 10/15/2016   Procedure: REPAIR EXTENSOR TENDON- EIP TO EPL TRANSFER-LEFT HAND;  Surgeon: Kennedy Bucker, MD;  Location: [facility];  Service: Orthopedics;  Laterality: Left;  . TUBAL LIGATION      There were no vitals filed for this visit.      Subjective Assessment - 12/29/16 1816    Subjective  Pt report using it more - and no pain at rest -did not work her scar all the time and did some weed wacking - pain at end and at end of work  day - scar tender if rubbing or hitting it    Patient Stated Goals Want to get my thumb better to use it more - driving , fasten bra, open door, pulling or pushing , - 1/2 backto work only    Currently in Pain? No/denies            [facility] OT Assessment - 12/29/16 0001      Strength   Right Hand Grip (lbs) 55   Right Hand Lateral Pinch 15 lbs   Right Hand 3 Point Pinch 15 lbs   Left Hand Grip (lbs) 45   Left Hand Lateral Pinch 15 lbs   Left Hand 3 Point Pinch 11 lbs                  OT Treatments/Exercises (OP) - 12/29/16 0001      LUE Paraffin   Number Minutes Paraffin 10 Minutes   LUE Paraffin Location Hand   Comments decrease pain and prior to manual therrapy on scar        Assess grip and prehension strength - progress well  PRWHE done - simulate act - pain still 17/50 but function improve from 38/50 to 3.5/50  Could lift and carry 10 lbs   Green putty  for gripping , lat and 3 point grip  10-15 reps  2 x day  10 reps  As long no pain at 1 st dorsal compartment   Pt to cont with   dexterity HEP - reinforce for pt that it is not  only for dexterity - but thumb IP extention - pt during functional task show AROM IP extention  THUMB <>fingers on cards , shuffle  Pick up small objects 2-5 - with 2 point pinch - put in palm and release out of palm same way 2 point pinch  Walk up and down pen with 3 point pinch  Chines ball  palm <>fingers tips  Using IP flexion and extention with all  KEEP pain under 2/10   Scar massage manually and Graston tool nr 2 for brushing and sweeping - with wrist and hand in flexion  kinesiotape done 30% either side and across 100 % pull  To do during day - cica scar pad for night time  Scar massage but pain should not be the whole day more than 2/10            OT Education - 12/29/16 1819    Education provided Yes   Education Details HEP updated    Person(s) Educated Patient   Methods  Explanation;Demonstration;Tactile cues;Verbal cues   Comprehension Verbal cues required;Returned demonstration;Verbalized understanding          OT Short Term Goals - 12/08/16 1635      OT SHORT TERM GOAL #1   Title L thumb AROM improve for pt to show AROM at thumb IP and MP extention WNL to get glove on , reach around glass , in pocket    Baseline IP and MP extention - 10 to -15 - can do PROM full - place and hold at these measurements   Time 3   Period Weeks   Status New   Target Date 12/29/16     OT SHORT TERM GOAL #2   Title L wrist AROM for flexion and extention improve with 20 degrees to use 75% at least L hand in ADL's    Baseline L wrist flexion 34, extention 40   Time 3   Period Weeks   Status New   Target Date 12/29/16     OT SHORT TERM GOAL #3   Title Pt to be independend in HEP to increase  L thumb and wrist ROM , decrease scar tissue to be able to massage , do rougher textures and tapping on scar with no symptoms     Baseline cannot tolerate massage, soft or rougher textures-    Time 4   Period Weeks   Status New   Target Date 01/05/17           OT Long Term Goals - 12/08/16 1640      OT LONG TERM GOAL #1   Title L thumb strength improve for pt to show 4/5 to type, push buttons , do buttons, pull and push with thumb in ADl's and IADl's    Baseline Cannot actively do full extention of thumb IP and MP - PA and RA 60 but not able to resist    Time 6   Period Weeks   Status New   Target Date 01/19/17     OT LONG TERM GOAL #2   Title Pain on PRWHE improve for L hand by at least 15 points    Baseline at eval pain on PRWHE 19/50  - mostly scar  tissue ,tender and hyper sensitive    Time 4   Period Weeks   Status New   Target Date 01/05/17     OT LONG TERM GOAL #3   Title Function on PRWHE improve for pt with at least 20 points to use L hand with no symptoms    Baseline function on PRWHE at eval 38.5/50    Time 6   Period Weeks   Status New   Target  Date 01/19/17               Plan - 12/29/16 1820    Occupational performance deficits (Please refer to evaluation for details): ADL's;IADL's;Work;Play;Leisure   Rehab Potential Good   OT Frequency 2x / week   OT Duration 4 weeks   OT Treatment/Interventions Self-care/ADL training;Fluidtherapy;Splinting;Patient/family education;Therapeutic exercises;Scar mobilization;Ultrasound;Cryotherapy;Passive range of motion;Manual Therapy;Parrafin   Plan cont to decrease scar tissue    Clinical Decision Making Several treatment options, min-mod task modification necessary   OT Home Exercise Plan see pt instructions   Consulted and Agree with Plan of Care Patient      Patient will benefit from skilled therapeutic intervention in order to improve the following deficits and impairments:  Impaired flexibility, Pain, Decreased scar mobility, Decreased coordination, Decreased range of motion, Decreased strength, Impaired UE functional use, Decreased knowledge of precautions  Visit Diagnosis: Scar condition and fibrosis of skin  Stiffness of left hand, not elsewhere classified  Muscle weakness (generalized)  Pain in left hand  Stiffness of left wrist, not elsewhere classified    Problem List Patient Active Problem List   Diagnosis Date Noted  . Pain of left hand 06/25/2016  . Chronic obstructive pulmonary disease (HCC) 03/21/2016  . Cystocele   . Migraine with aura   . Hyperthyroidism 11/20/2014  . Tobacco abuse 11/20/2014  . Migraines 11/20/2014    Oletta Cohn OTR/L,CLT  12/29/2016, 6:22 PM  [place] [facility]TS [facility]IC[facility] [address] [place], [address] Phone: [phone]   Fax:  [phone]  Name: [place] Giammona MRN: [id number] Date of Birth: 1960/06/02

## 2016-12-29 NOTE — Patient Instructions (Signed)
Pt to use hand in all act - scar massage 2 x day  Cont kinesiotape during day  cica scar pad for night time use   Wrist flexion stretch with scar massage  2 lbs weight for wrist in all planes  10 reps  2 x day  Hold off on putty and PA/RA of thumb

## 2017-01-02 ENCOUNTER — Ambulatory Visit: Payer: 59 | Admitting: Occupational Therapy

## 2017-01-07 ENCOUNTER — Ambulatory Visit: Payer: 59 | Admitting: Occupational Therapy

## 2017-01-07 DIAGNOSIS — L905 Scar conditions and fibrosis of skin: Secondary | ICD-10-CM

## 2017-01-07 DIAGNOSIS — M6281 Muscle weakness (generalized): Secondary | ICD-10-CM

## 2017-01-07 DIAGNOSIS — M25632 Stiffness of left wrist, not elsewhere classified: Secondary | ICD-10-CM

## 2017-01-07 DIAGNOSIS — M25642 Stiffness of left hand, not elsewhere classified: Secondary | ICD-10-CM

## 2017-01-07 DIAGNOSIS — M79642 Pain in left hand: Secondary | ICD-10-CM

## 2017-01-07 NOTE — Therapy (Signed)
[facility] Sep 03, [address] [place], [address] Phone: [phone]   Fax:  [phone]  Occupational Therapy Treatment  Patient Details  Name: Sonya Rodriguez MRN: [id number] Date of Birth: 03-Jan-1961 Referring Provider: Dr Rosita Kea   Encounter Date: 01/07/2017      OT End of Session - 01/07/17 1909    Visit Number 7   Number of Visits 12   Date for OT Re-Evaluation 01/19/17   OT Start Time 1246   OT Stop Time 1334   OT Time Calculation (min) 48 min   Activity Tolerance Patient tolerated treatment well   Behavior During Therapy [facility] for tasks assessed/performed      Past Medical History:  Diagnosis Date  . Anxiety    attack when her mom died  . Bronchitis Sep 03, 2016   pt states mostly resolved but still has dry cough intermittently  . Complication of anesthesia    woke up during arm surgery-pain not controlled in PACU after arm surgery  . COPD (chronic obstructive pulmonary disease) (HCC)   . Cystocele 5/17   fitted with a #6 ring pessary with support in 6/17  . Family history of adverse reaction to anesthesia    son-agitated when coming out of anesthesia (he was only 47-67 years old)  . Hyperthyroidism    no meds  . Insomnia   . Migraine with aura   . Thyroid disease    Hyperthyroid    Past Surgical History:  Procedure Laterality Date  . CHOLECYSTECTOMY    . ORIF DISTAL RADIUS FRACTURE Left 06/2011   Plate + Screws, Dr. Rosita Kea, [facility]  . REPAIR EXTENSOR TENDON Left 10/15/2016   Procedure: REPAIR EXTENSOR TENDON- EIP TO EPL TRANSFER-LEFT HAND;  Surgeon: Kennedy Bucker, MD;  Location: [facility] ORS;  Service: Orthopedics;  Laterality: Left;  . TUBAL LIGATION      There were no vitals filed for this visit.      Subjective Assessment - 01/07/17 1247    Subjective  MY thumb feels weird some times - if I go and pick up something - I have to think what I am doing - my thumb wants to go to the side - that  scar on top of wrist still sore and if I hit it - I am using it more - but it is not my dominant hand    Patient Stated Goals Want to get my thumb better to use it more - driving , fasten bra, open door, pulling or pushing , - 1/2 backto work only    Currently in Pain? Yes   Pain Score 1    Pain Location Wrist   Pain Orientation Left   Pain Descriptors / Indicators Aching   Pain Type Surgical pain            [facility] OT Assessment - 01/07/17 0001      Strength   Right Hand Grip (lbs) 55   Right Hand Lateral Pinch 15 lbs   Right Hand 3 Point Pinch 15 lbs   Left Hand Grip (lbs) 48   Left Hand Lateral Pinch 14 lbs   Left Hand 3 Point Pinch 12 lbs        Measure grip and prehension strength - see flowsheet  wrist AROM improve greatly and strength  Cont to have tenderness and some scar adhesion over dorsal wrist - but improve also greatly  Assess IP extention during AROM and functional tasks Thumb IP extention  cont to be 3+/5 - initiate new isometric strengthening HEP  And pt to cont with HEP - quality more important than quantity Scar massage done manual by OT and vibration           OT Treatments/Exercises (OP) - 01/07/17 0001      LUE Paraffin   Number Minutes Paraffin 10 Minutes   LUE Paraffin Location Hand;Wrist   Comments decrease pain and increase ROM                 OT Education - 01/07/17 1909    Education provided Yes   Education Details Add thumb IP strengthening HEP    Person(s) Educated Patient;Child(ren)   Methods Explanation;Demonstration;Tactile cues;Verbal cues   Comprehension Verbalized understanding;Returned demonstration;Verbal cues required          OT Short Term Goals - 12/08/16 1635      OT SHORT TERM GOAL #1   Title L thumb AROM improve for pt to show AROM at thumb IP and MP extention WNL to get glove on , reach around glass , in pocket    Baseline IP and MP extention - 10 to -15 - can do PROM full - place and hold at these  measurements   Time 3   Period Weeks   Status New   Target Date 12/29/16     OT SHORT TERM GOAL #2   Title L wrist AROM for flexion and extention improve with 20 degrees to use 75% at least L hand in ADL's    Baseline L wrist flexion 34, extention 40   Time 3   Period Weeks   Status New   Target Date 12/29/16     OT SHORT TERM GOAL #3   Title Pt to be independend in HEP to increase  L thumb and wrist ROM , decrease scar tissue to be able to massage , do rougher textures and tapping on scar with no symptoms     Baseline cannot tolerate massage, soft or rougher textures-    Time 4   Period Weeks   Status New   Target Date 01/05/17           OT Long Term Goals - 12/08/16 1640      OT LONG TERM GOAL #1   Title L thumb strength improve for pt to show 4/5 to type, push buttons , do buttons, pull and push with thumb in ADl's and IADl's    Baseline Cannot actively do full extention of thumb IP and MP - PA and RA 60 but not able to resist    Time 6   Period Weeks   Status New   Target Date 01/19/17     OT LONG TERM GOAL #2   Title Pain on PRWHE improve for L hand by at least 15 points    Baseline at eval pain on PRWHE 19/50  - mostly scar tissue ,tender and hyper sensitive    Time 4   Period Weeks   Status New   Target Date 01/05/17     OT LONG TERM GOAL #3   Title Function on PRWHE improve for pt with at least 20 points to use L hand with no symptoms    Baseline function on PRWHE at eval 38.5/50    Time 6   Period Weeks   Status New   Target Date 01/19/17               Plan - 01/07/17 1910  Clinical Impression Statement Pt show great progress in wrist , grip and prehension strength - has full AROM for IP extention during functional tasks but compensate with proximal joints to extend if need to do intentional - decrease strength in IP - intiated IP isometric strength this date - cont to have scar tissue over dorsal wrist- improved greatly but still tender and   "bump"    Occupational performance deficits (Please refer to evaluation for details): ADL's;IADL's;Work;Play;Leisure   Rehab Potential Good   OT Frequency 1x / week   OT Duration 4 weeks   OT Treatment/Interventions Self-care/ADL training;Fluidtherapy;Splinting;Patient/family education;Therapeutic exercises;Scar mobilization;Ultrasound;Cryotherapy;Passive range of motion;Manual Therapy;Parrafin   Plan assess if increase IP extention strength    Clinical Decision Making Limited treatment options, no task modification necessary   OT Home Exercise Plan see pt instructions   Consulted and Agree with Plan of Care Patient;Family member/caregiver      Patient will benefit from skilled therapeutic intervention in order to improve the following deficits and impairments:  Impaired flexibility, Pain, Decreased scar mobility, Decreased coordination, Decreased range of motion, Decreased strength, Impaired UE functional use, Decreased knowledge of precautions  Visit Diagnosis: Scar condition and fibrosis of skin  Stiffness of left hand, not elsewhere classified  Muscle weakness (generalized)  Pain in left hand  Stiffness of left wrist, not elsewhere classified    Problem List Patient Active Problem List   Diagnosis Date Noted  . Pain of left hand 06/25/2016  . Chronic obstructive pulmonary disease (HCC) 03/21/2016  . Cystocele   . Migraine with aura   . Hyperthyroidism 11/20/2014  . Tobacco abuse 11/20/2014  . Migraines 11/20/2014    Oletta Cohn OTR/L,CLT 01/07/2017, 7:59 PM  [place] [facility] [address] [place], [address] Phone: [phone]   Fax:  [phone]  Name: Sonya Rodriguez MRN: [id number] Date of Birth: 09-20-1960

## 2017-01-09 ENCOUNTER — Ambulatory Visit: Payer: 59 | Admitting: Occupational Therapy

## 2017-01-14 ENCOUNTER — Ambulatory Visit: Payer: 59 | Admitting: Occupational Therapy

## 2017-01-19 ENCOUNTER — Ambulatory Visit: Payer: 59 | Attending: Orthopedic Surgery | Admitting: Occupational Therapy

## 2017-01-19 DIAGNOSIS — L905 Scar conditions and fibrosis of skin: Secondary | ICD-10-CM | POA: Diagnosis present

## 2017-01-19 DIAGNOSIS — M79642 Pain in left hand: Secondary | ICD-10-CM | POA: Diagnosis present

## 2017-01-19 DIAGNOSIS — M6281 Muscle weakness (generalized): Secondary | ICD-10-CM | POA: Insufficient documentation

## 2017-01-19 DIAGNOSIS — M25642 Stiffness of left hand, not elsewhere classified: Secondary | ICD-10-CM | POA: Diagnosis present

## 2017-01-19 DIAGNOSIS — M25632 Stiffness of left wrist, not elsewhere classified: Secondary | ICD-10-CM | POA: Insufficient documentation

## 2017-01-19 NOTE — Therapy (Signed)
[place] [facility] Sep 17, [address], [place], [address] Phone: [phone]   Fax:  [phone]  Occupational Therapy Treatment  Patient Details  Name: Sonya Rodriguez MRN: [id number] Date of Birth: [birth year] Referring Provider: Dr Rosita Kea   Encounter Date: 01/19/2017      OT End of Session - 01/19/17 1452    Visit Number 8   Number of Visits 12   Date for OT Re-Evaluation 01/19/17   OT Start Time 1348   OT Stop Time 1435   OT Time Calculation (min) 47 min   Activity Tolerance Patient tolerated treatment well   Behavior During Therapy [facility] for tasks assessed/performed      Past Medical History:  Diagnosis Date  . Anxiety    attack when her mom died  . Bronchitis 09/17/2016   pt states mostly resolved but still has dry cough intermittently  . Complication of anesthesia    woke up during arm surgery-pain not controlled in PACU after arm surgery  . COPD (chronic obstructive pulmonary disease) (HCC)   . Cystocele 5/17   fitted with a #6 ring pessary with support in 6/17  . Family history of adverse reaction to anesthesia    son-agitated when coming out of anesthesia (he was only 70-45 years old)  . Hyperthyroidism    no meds  . Insomnia   . Migraine with aura   . Thyroid disease    Hyperthyroid    Past Surgical History:  Procedure Laterality Date  . CHOLECYSTECTOMY    . ORIF DISTAL RADIUS FRACTURE Left 06/2011   Plate + Screws, Dr. Rosita Kea, [facility]  . REPAIR EXTENSOR TENDON Left 10/15/2016   Procedure: REPAIR EXTENSOR TENDON- EIP TO EPL TRANSFER-LEFT HAND;  Surgeon: Kennedy Bucker, MD;  Location: [facility];  Service: Orthopedics;  Laterality: Left;  . TUBAL LIGATION      There were no vitals filed for this visit.      Subjective Assessment - 01/19/17 1449    Subjective  Seen the PA - he said doing okay - will take year and looked at the bump at my thumb - thought could be cyst - still feels like it is not my  thumb - keeping it in my palm feel more normal -   Patient Stated Goals Want to get my thumb better to use it more - driving , fasten bra, open door, pulling or pushing , - 1/2 backto work only    Currently in Pain? Yes   Pain Score 2    Pain Location Wrist   Pain Orientation Left   Pain Descriptors / Indicators Aching   Pain Type Surgical pain                      OT Treatments/Exercises (OP) - 01/19/17 0001      LUE Paraffin   Number Minutes Paraffin 10 Minutes   LUE Paraffin Location Hand;Wrist   Comments prior to scar massage and mobs        wrist AROM improve greatly and strength  Cont to have tenderness and some scar adhesion over dorsal wrist - but improve also greatly and tenderness over 2nd MC - scar - ? stitch Assess IP extention during AROM and functional tasks  Thumb IP extention cont to be 3+/5 - Review again isometric strengthening HEP - daughter to assist to stabilize MP and CMC of thumb  And light blue putty - with hand in fist  in table and thumb to side - stabilize proximal - IP extention into putty  Daughter to assist   pt to do every day - and reinforce it will take time - and has to isolate IP extention    quality more important than quantity Scar massage done manual by OT and vibration with wrist and hand in fist and flexion - and in resting position - tender over both            OT Education - 01/19/17 1452    Education provided Yes   Education Details thumb IP extention strengthening    Person(s) Educated Patient;Child(ren)   Methods Explanation;Demonstration;Tactile cues;Verbal cues   Comprehension Verbal cues required;Returned demonstration;Verbalized understanding          OT Short Term Goals - 12/08/16 1635      OT SHORT TERM GOAL #1   Title L thumb AROM improve for pt to show AROM at thumb IP and MP extention WNL to get glove on , reach around glass , in pocket    Baseline IP and MP extention - 10 to -15 - can do PROM full  - place and hold at these measurements   Time 3   Period Weeks   Status New   Target Date 12/29/16     OT SHORT TERM GOAL #2   Title L wrist AROM for flexion and extention improve with 20 degrees to use 75% at least L hand in ADL's    Baseline L wrist flexion 34, extention 40   Time 3   Period Weeks   Status New   Target Date 12/29/16     OT SHORT TERM GOAL #3   Title Pt to be independend in HEP to increase  L thumb and wrist ROM , decrease scar tissue to be able to massage , do rougher textures and tapping on scar with no symptoms     Baseline cannot tolerate massage, soft or rougher textures-    Time 4   Period Weeks   Status New   Target Date 01/05/17           OT Long Term Goals - 12/08/16 1640      OT LONG TERM GOAL #1   Title L thumb strength improve for pt to show 4/5 to type, push buttons , do buttons, pull and push with thumb in ADl's and IADl's    Baseline Cannot actively do full extention of thumb IP and MP - PA and RA 60 but not able to resist    Time 6   Period Weeks   Status New   Target Date 01/19/17     OT LONG TERM GOAL #2   Title Pain on PRWHE improve for L hand by at least 15 points    Baseline at eval pain on PRWHE 19/50  - mostly scar tissue ,tender and hyper sensitive    Time 4   Period Weeks   Status New   Target Date 01/05/17     OT LONG TERM GOAL #3   Title Function on PRWHE improve for pt with at least 20 points to use L hand with no symptoms    Baseline function on PRWHE at eval 38.5/50    Time 6   Period Weeks   Status New   Target Date 01/19/17               Plan - 01/19/17 1452    Clinical Impression Statement Pt cont to show decrease  thumb IP hyper extention - compensate with proximal thumb - pt needed review of HEP from last time - ? how much done - reinforce for pt to do HEP daily for 3 wks and will reassess - cont scar massage - ? stitch at scar on 2nd MC - pt still constant pain  at all 3 scars - ? cyst at wrist - per  PA maybe at base of thumb    Occupational performance deficits (Please refer to evaluation for details): ADL's;IADL's;Work;Play;Leisure   Rehab Potential Good   OT Frequency Biweekly   OT Duration 4 weeks   OT Treatment/Interventions Self-care/ADL training;Fluidtherapy;Splinting;Patient/family education;Therapeutic exercises;Scar mobilization;Ultrasound;Cryotherapy;Passive range of motion;Manual Therapy;Parrafin   Plan reassess strength in IP of thumb    Clinical Decision Making Limited treatment options, no task modification necessary   OT Home Exercise Plan see pt instructions   Consulted and Agree with Plan of Care Patient;Family member/caregiver      Patient will benefit from skilled therapeutic intervention in order to improve the following deficits and impairments:  Impaired flexibility, Pain, Decreased scar mobility, Decreased coordination, Decreased range of motion, Decreased strength, Impaired UE functional use, Decreased knowledge of precautions  Visit Diagnosis: Scar condition and fibrosis of skin  Stiffness of left hand, not elsewhere classified  Muscle weakness (generalized)  Pain in left hand  Stiffness of left wrist, not elsewhere classified    Problem List Patient Active Problem List   Diagnosis Date Noted  . Pain of left hand 06/25/2016  . Chronic obstructive pulmonary disease (HCC) 03/21/2016  . Cystocele   . Migraine with aura   . Hyperthyroidism 11/20/2014  . Tobacco abuse 11/20/2014  . Migraines 11/20/2014    Oletta Cohn OTR/L,CLT 01/19/2017, 2:55 PM  [place] [facility] [address] [place], [address] Phone: [phone]   Fax:  [phone]  Name: Sonya Rodriguez MRN: [id number] Date of Birth: 03/04/1961

## 2017-01-19 NOTE — Patient Instructions (Signed)
Pt to isolate thumb IP for isometric strengthening and pushing into putty - light blue  But daughter to assist to stabilize MP and CMC   not over do

## 2017-02-09 ENCOUNTER — Ambulatory Visit: Payer: 59 | Admitting: Occupational Therapy

## 2017-02-09 DIAGNOSIS — M6281 Muscle weakness (generalized): Secondary | ICD-10-CM

## 2017-02-09 DIAGNOSIS — M25632 Stiffness of left wrist, not elsewhere classified: Secondary | ICD-10-CM

## 2017-02-09 DIAGNOSIS — L905 Scar conditions and fibrosis of skin: Secondary | ICD-10-CM

## 2017-02-09 DIAGNOSIS — M79642 Pain in left hand: Secondary | ICD-10-CM

## 2017-02-09 DIAGNOSIS — M25642 Stiffness of left hand, not elsewhere classified: Secondary | ICD-10-CM

## 2017-02-09 NOTE — Patient Instructions (Signed)
Cont isometric for IP of thumb for extention -stabilize proximal thumb  Stretch for webspace and place and hold - thumb RA

## 2017-02-09 NOTE — Therapy (Signed)
[place] [facility] [address], [place], [address] Phone: [phone]   Fax:  [phone]  Occupational Therapy Treatment  Patient Details  Name: Sonya Rodriguez MRN: [id number] Date of Birth: [birth year] Referring Provider: Dr Rudene Christians   Encounter Date: 02/09/2017      OT End of Session - 02/09/17 1510    Visit Number 9   Number of Visits 9   Date for OT Re-Evaluation 02/09/17   OT Start Time 1430   OT Stop Time 1501   OT Time Calculation (min) 31 min   Activity Tolerance Patient tolerated treatment well   Behavior During Therapy [facility] for tasks assessed/performed      Past Medical History:  Diagnosis Date  . Anxiety    attack when her mom died  . Bronchitis 2016/10/05   pt states mostly resolved but still has dry cough intermittently  . Complication of anesthesia    woke up during arm surgery-pain not controlled in PACU after arm surgery  . COPD (chronic obstructive pulmonary disease) ([place])   . Cystocele 5/17   fitted with a #6 ring pessary with support in 6/17  . Family history of adverse reaction to anesthesia    son-agitated when coming out of anesthesia (he was only 56 years old)  . Hyperthyroidism    no meds  . Insomnia   . Migraine with aura   . Thyroid disease    Hyperthyroid    Past Surgical History:  Procedure Laterality Date  . CHOLECYSTECTOMY    . ORIF DISTAL RADIUS FRACTURE Left 06/2011   Plate + Screws, Dr. Rudene Christians, [place]  . REPAIR EXTENSOR TENDON Left 10/15/2016   Procedure: REPAIR EXTENSOR TENDON- EIP TO EPL TRANSFER-LEFT HAND;  Surgeon: Hessie Knows, MD;  Location: [facility];  Service: Orthopedics;  Laterality: Left;  . TUBAL LIGATION      There were no vitals filed for this visit.      Subjective Assessment - 02/09/17 1508    Subjective  I just have constant pain over my index finger knuckle - since I have hit it - it stays sore - it goes up to 10/10 - and I am so busy - did  do some the exercises- tip of thumb still not right and my thumb still feels sometimes it stays in if reaching    Patient Stated Goals Want to get my thumb better to use it more - driving , fasten bra, open door, pulling or pushing , - 1/2 backto work only    Currently in Pain? Yes   Pain Score 3    Pain Location Finger (Comment which one)   Pain Orientation Left   Pain Descriptors / Indicators Aching   Pain Type Surgical pain            [facility] OT Assessment - 02/09/17 0001      Strength   Right Hand Grip (lbs) 55   Right Hand Lateral Pinch 15 lbs   Right Hand 3 Point Pinch 15 lbs   Left Hand Grip (lbs) 45   Left Hand Lateral Pinch 14 lbs   Left Hand 3 Point Pinch 13 lbs        wrist AROM improve greatly and strength - 5/5 and no pain - AROM WFL  Cont to have some scar adhesion over dorsal wrist -  But pain improve But pt showed increase pain and tenderness  over 2nd [facility] - pt report she hit it  against something  Assess IP extention during AROM and functional tasks- has full AROM if not paying attention - but fatigue and compensate with proximal thumb   Thumb IP extention cont to be 3/5 - Review again isometric strengthening HEP - daughter to assist to stabilize MP and CMC of thumb   pt to do every day - and reinforce it will take time - and has to isolate IP extention    quality more important than quantity Pt to phone Dr Rudene Christians about continues pain over 2nd [facility]                  OT Education - 02/09/17 1510    Education provided Yes   Education Details HEP for thumb    Person(s) Educated Patient;Child(ren)   Methods Explanation;Demonstration;Verbal cues;Tactile cues   Comprehension Returned demonstration;Verbalized understanding          OT Short Term Goals - 02/09/17 1514      OT SHORT TERM GOAL #1   Title L thumb AROM improve for pt to show AROM at thumb IP and MP extention WNL to get glove on , reach around glass , in pocket    Baseline Has full AROM -  but fatigue - has strengthening HEP for IP exention - do better if do automatic    Status Achieved     OT SHORT TERM GOAL #2   Title L wrist AROM for flexion and extention improve with 20 degrees to use 75% at least L hand in ADL's    Status Achieved     OT SHORT TERM GOAL #3   Title Pt to be independend in HEP to increase  L thumb and wrist ROM , decrease scar tissue to be able to massage , do rougher textures and tapping on scar with no symptoms     Baseline Was doing good- but tender and pain over 2nd [facility] after hitting it few wks ago   Status Achieved           OT Long Term Goals - 02/09/17 1515      OT LONG TERM GOAL #1   Title L thumb strength improve for pt to show 4/5 to type, push buttons , do buttons, pull and push with thumb in ADl's and IADl's    Baseline Strength 5/5 except IP of thumb in exention 3/5 - has HEP to do at home    Status Partially [place] #2   Title Pain on PRWHE improve for L hand by at least 15 points    Baseline at eval pain on PRWHE 19/50  - 31/50 after hitting 2nd [facility] - and with using it - or working on computer - increase pian    Status Not Met     OT LONG TERM GOAL #3   Title Function on PRWHE improve for pt with at least 20 points to use L hand with no symptoms    Baseline function on PRWHE at eval 38.5/50 - 0/50 able to use in all activities    Status Achieved               Plan - 02/09/17 1511    Clinical Impression Statement Pt arrive for reassessment after doing HEP for 3 wks - pt report she was very busy - and did exercises some - did hit her 2nd [facility] against something and since then it hurts all the time - tender - and that  thumb still feels like it is tight when reaching - wrist scar improved but pt still knot on dorsal wrist - ROM  WNL at wrist and digits - except decrease strength in IP extention - but has full AROM but fatigue - pt has HEP to cont with but adviice her to call Dr Rudene Christians about pain after hitting 2nd  digit MC , knot at  dorsal wrist    OT Treatment/Interventions Self-care/ADL training;Fluidtherapy;Splinting;Patient/family education;Therapeutic exercises;Scar mobilization;Ultrasound;Cryotherapy;Passive range of motion;Manual Therapy;Parrafin   Plan discharge with HEP    Clinical Decision Making Limited treatment options, no task modification necessary   OT Home Exercise Plan see pt instructions   Consulted and Agree with Plan of Care Patient;Family member/caregiver      Patient will benefit from skilled therapeutic intervention in order to improve the following deficits and impairments:     Visit Diagnosis: Scar condition and fibrosis of skin  Stiffness of left hand, not elsewhere classified  Muscle weakness (generalized)  Pain in left hand  Stiffness of left wrist, not elsewhere classified    Problem List Patient Active Problem List   Diagnosis Date Noted  . Pain of left hand 06/25/2016  . Chronic obstructive pulmonary disease ([place]) 03/21/2016  . Cystocele   . Migraine with aura   . Hyperthyroidism 11/20/2014  . Tobacco abuse 11/20/2014  . Migraines 11/20/2014    Rosalyn Gess OTR/L,CLT 02/09/2017, 4:38 PM  [place] [facility] [address], [place], [address] Phone: [phone]   Fax:  [phone]  Name: Railey Glad MRN: [id number] Date of Birth: 1960/09/20

## 2017-07-02 ENCOUNTER — Ambulatory Visit: Payer: Self-pay | Admitting: Primary Care

## 2017-07-20 ENCOUNTER — Other Ambulatory Visit: Payer: Self-pay | Admitting: Primary Care

## 2017-07-20 DIAGNOSIS — J449 Chronic obstructive pulmonary disease, unspecified: Secondary | ICD-10-CM

## 2017-07-21 ENCOUNTER — Telehealth: Payer: Self-pay | Admitting: Primary Care

## 2017-07-21 DIAGNOSIS — J449 Chronic obstructive pulmonary disease, unspecified: Secondary | ICD-10-CM

## 2017-07-21 MED ORDER — BUDESONIDE-FORMOTEROL FUMARATE 160-4.5 MCG/ACT IN AERO: 2.0000 | INHALATION_SPRAY | Freq: Two times a day (BID) | RESPIRATORY_TRACT | 0 refills | Status: DC

## 2017-07-21 NOTE — Telephone Encounter (Signed)
Send as requested 

## 2017-07-21 NOTE — Telephone Encounter (Signed)
Copied from CRM [id number]#[id number]. Topic: Quick Communication - Rx Refill/Question >> Jul 21, 2017  2:13 PM Jonette EvaBarksdale, Harvey B wrote: Pt states the medicaction below needs to be for 90 days  Medication: SYMBICORT 160-4.5 MCG/ACT inhaler [[id number]][[id number]]  Has the patient contacted their pharmacy? Yes.   (Agent: If no, request that the patient contact the pharmacy for the refill.) Preferred Pharmacy (with phone number or street name): [facility] Agent: Please be advised that RX refills may take up to 3 business days. We ask that you follow-up with your pharmacy.

## 2017-07-22 ENCOUNTER — Telehealth: Payer: Self-pay | Admitting: Primary Care

## 2017-07-22 ENCOUNTER — Encounter: Payer: Self-pay | Admitting: Primary Care

## 2017-07-22 NOTE — Telephone Encounter (Signed)
Pt called with with c/o of not getting a refill for 90 days of Symbicort. The last one she received was for 30 days, and that was on 07/20/17. She is very upset that she did not get a 90 day supply. [facility] notified that pt has requested to speak to a provider regarding this.  Pt's call was transferred to office.

## 2017-07-22 NOTE — Telephone Encounter (Addendum)
Spoke with patient who was very rude with me as well, threatened to leave the practice.  She did calm down and I was able to explain that her Symbicort was sent in for a three month supply this morning.  Did remind her that she'll be due for follow up in July/August this year, she verbalized understanding. She did apologize for her behavior with me on the phone.  I then called her pharmacy [facility]) and verified with the pharmacist that her Symbicort was sent in for 90 day supply this morning. I called her back, left a voicemail stating that she'll be able to pick up her Symbicort on 08/16/17 for a three month supply.    See phone note under Colin BentonMartha Crossland in regards to birth control.

## 2017-07-22 NOTE — Telephone Encounter (Signed)
I spoke with pt concerning the 90 day supply of symbicort she is requesting. I tried to explain it has been fixed, it was an error and apologize but pt was incredibly rude and would not let me finish a sentence. She was frustrated and said this happens every time. She then asked about Martha's medication. I asked who Johnny BridgeMartha was and she sighed very loudly and said "of course you wouldn't". When I asked Martha's DOB she said it so quickly and breathy I could not understand so I asked her to repeat it. She then called it back to me very slow and angry. I pulled up her chart and said we received a message 4/2 and she said I was lying b/c she requested it weeks ago. I told her I was not lying, I was looking at the message from yesterday and I would send a message to Mayra ReelKate Clark to check on this and she hung up on me.

## 2017-07-28 ENCOUNTER — Encounter: Payer: Self-pay | Admitting: Primary Care

## 2017-07-28 NOTE — Telephone Encounter (Signed)
Please see communication with patient above in email.  Thanks.

## 2017-07-28 NOTE — Telephone Encounter (Signed)
Will you please find out what's going on? Thank you!

## 2017-09-09 ENCOUNTER — Other Ambulatory Visit: Payer: Self-pay | Admitting: Primary Care

## 2017-09-09 DIAGNOSIS — J449 Chronic obstructive pulmonary disease, unspecified: Secondary | ICD-10-CM

## 2017-12-27 ENCOUNTER — Other Ambulatory Visit: Payer: Self-pay | Admitting: Primary Care

## 2017-12-27 DIAGNOSIS — J449 Chronic obstructive pulmonary disease, unspecified: Secondary | ICD-10-CM

## 2018-07-05 ENCOUNTER — Telehealth: Payer: Self-pay | Admitting: Primary Care

## 2018-07-05 NOTE — Telephone Encounter (Signed)
Error not needed

## 2018-09-14 ENCOUNTER — Emergency Department (HOSPITAL_COMMUNITY)
Admission: EM | Admit: 2018-09-14 | Discharge: 2018-09-14 | Disposition: A | Discharge status: Home / Self Care | Payer: 59 | Attending: Emergency Medicine | Admitting: Emergency Medicine

## 2018-09-14 ENCOUNTER — Other Ambulatory Visit: Payer: Self-pay

## 2018-09-14 ENCOUNTER — Emergency Department (HOSPITAL_COMMUNITY): Payer: 59

## 2018-09-14 ENCOUNTER — Encounter (HOSPITAL_COMMUNITY): Payer: Self-pay | Admitting: Emergency Medicine

## 2018-09-14 DIAGNOSIS — R0789 Other chest pain: Secondary | ICD-10-CM | POA: Diagnosis not present

## 2018-09-14 DIAGNOSIS — Z87891 Personal history of nicotine dependence: Secondary | ICD-10-CM | POA: Insufficient documentation

## 2018-09-14 DIAGNOSIS — Z79899 Other long term (current) drug therapy: Secondary | ICD-10-CM | POA: Diagnosis not present

## 2018-09-14 DIAGNOSIS — R079 Chest pain, unspecified: Secondary | ICD-10-CM | POA: Diagnosis present

## 2018-09-14 DIAGNOSIS — J449 Chronic obstructive pulmonary disease, unspecified: Secondary | ICD-10-CM | POA: Diagnosis not present

## 2018-09-14 LAB — CBC
HCT: 43.6 % (ref 36.0–46.0)
Hemoglobin: 14.1 g/dL (ref 12.0–15.0)
MCH: 30.1 pg (ref 26.0–34.0)
MCHC: 32.3 g/dL (ref 30.0–36.0)
MCV: 93.2 fL (ref 80.0–100.0)
Platelets: 266 10*3/uL (ref 150–400)
RBC: 4.68 MIL/uL (ref 3.87–5.11)
RDW: 12.8 % (ref 11.5–15.5)
WBC: 5.5 10*3/uL (ref 4.0–10.5)
nRBC: 0 % (ref 0.0–0.2)

## 2018-09-14 LAB — BASIC METABOLIC PANEL
Anion gap: 13 (ref 5–15)
BUN: 8 mg/dL (ref 6–20)
CO2: 18 mmol/L — ABNORMAL LOW (ref 22–32)
Calcium: 9.4 mg/dL (ref 8.9–10.3)
Chloride: 111 mmol/L (ref 98–111)
Creatinine, Ser: 0.72 mg/dL (ref 0.44–1.00)
GFR calc Af Amer: >60 mL/min (ref >60)
GFR calc non Af Amer: >60 mL/min (ref >60)
Glucose, Bld: 89 mg/dL (ref 70–99)
Potassium: 4.2 mmol/L (ref 3.5–5.1)
Sodium: 142 mmol/L (ref 135–145)

## 2018-09-14 LAB — I-STAT BETA HCG BLOOD, ED (MC, WL, AP ONLY): I-stat hCG, quantitative: <5 m[IU]/mL (ref <5)

## 2018-09-14 LAB — TROPONIN I: Troponin I: <0.03 ng/mL (ref <0.03)

## 2018-09-14 MED ORDER — DIPHENHYDRAMINE HCL 25 MG PO CAPS
50.0000 mg | ORAL_CAPSULE | Freq: Once | ORAL | Status: AC
  Administered 2018-09-14: 50 mg via ORAL
  Filled 2018-09-14: qty 2

## 2018-09-14 MED ORDER — SODIUM CHLORIDE 0.9% FLUSH
3.0000 mL | Freq: Once | INTRAVENOUS | Status: AC
  Administered 2018-09-14: 17:00:00 3 mL via INTRAVENOUS

## 2018-09-14 MED ORDER — KETOROLAC TROMETHAMINE 15 MG/ML IJ SOLN
15.0000 mg | Freq: Once | INTRAMUSCULAR | Status: AC
  Administered 2018-09-14: 18:00:00 15 mg via INTRAVENOUS
  Filled 2018-09-14: qty 1

## 2018-09-14 MED ORDER — PROCHLORPERAZINE EDISYLATE 10 MG/2ML IJ SOLN
10.0000 mg | Freq: Once | INTRAMUSCULAR | Status: AC
  Administered 2018-09-14: 10 mg via INTRAVENOUS
  Filled 2018-09-14: qty 2

## 2018-09-14 MED ORDER — LACTATED RINGERS IV BOLUS
1000.0000 mL | Freq: Once | INTRAVENOUS | Status: AC
  Administered 2018-09-14: 1000 mL via INTRAVENOUS

## 2018-09-14 NOTE — ED Triage Notes (Signed)
Rt Cp and sob x 4 days  Started sat,  Some naussea  Dizzy and sweating she states

## 2018-09-14 NOTE — ED Notes (Signed)
Taken to XR

## 2018-09-14 NOTE — ED Provider Notes (Signed)
I saw and evaluated the patient, reviewed the resident's note and I agree with the findings and plan.  EKG: EKG Interpretation  Date/Time:  Tuesday Sep 14 2018 12:50:16 EDT Ventricular Rate:  88 PR Interval:  110 QRS Duration: 84 QT Interval:  378 QTC Calculation: 457 R Axis:   43 Text Interpretation:  Sinus rhythm with sinus arrhythmia with short PR Otherwise normal ECG Confirmed by Lorre Nick ([id number]) on 09/14/2018 5:20:12 PM  58 year old female presents with right-sided chest discomfort that is reproducible.  EKG without ischemic changes.  Troponin negative.  Suspect musculoskeletal chest pain.  Will treat for that and discharge   Lorre Nick, MD 09/14/18 1749
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
## 2018-09-15 ENCOUNTER — Telehealth: Payer: Self-pay

## 2018-09-15 NOTE — Telephone Encounter (Signed)
Attempted to reach patient on both primary and secondary phone numbers. Both attempts unsuccessful. Left message with contact info on both phones.  If patient returns phone call, please schedule ED f/u appt with PCP.

## 2018-09-16 NOTE — Telephone Encounter (Signed)
Second attempt to reach patient on both primary and secondary numbers. Attempts unsuccessful. Left message with contact info.  If patient returns phone call, please schedule ED f/u with PCP.

## 2018-09-16 NOTE — Telephone Encounter (Signed)
I don't believe she follows with our practice any longer.

## 2018-10-11 ENCOUNTER — Encounter (HOSPITAL_COMMUNITY): Payer: Self-pay | Admitting: Psychiatry

## 2018-10-11 ENCOUNTER — Ambulatory Visit (INDEPENDENT_AMBULATORY_CARE_PROVIDER_SITE_OTHER): Payer: 59 | Admitting: Psychiatry

## 2018-10-11 DIAGNOSIS — F331 Major depressive disorder, recurrent, moderate: Secondary | ICD-10-CM | POA: Diagnosis not present

## 2018-10-11 DIAGNOSIS — F411 Generalized anxiety disorder: Secondary | ICD-10-CM

## 2018-10-11 DIAGNOSIS — F410 Panic disorder [episodic paroxysmal anxiety] without agoraphobia: Secondary | ICD-10-CM | POA: Diagnosis not present

## 2018-10-11 MED ORDER — ESCITALOPRAM OXALATE 10 MG PO TABS: 10.0000 mg | ORAL_TABLET | Freq: Every day | ORAL | 0 refills | Status: DC
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
## 2018-10-18 ENCOUNTER — Telehealth (HOSPITAL_COMMUNITY): Payer: Self-pay | Admitting: Psychiatry

## 2018-10-18 NOTE — Telephone Encounter (Signed)
[place] remind me if possible one day before so I review at appointment

## 2018-10-18 NOTE — Telephone Encounter (Signed)
Pt has FMLA paperwork faxed to our office.  She called and checked the status of this.  I informed her that it would not be filled out until the next office visit on 7/9. (next week).  She was ok with this.

## 2018-10-28 ENCOUNTER — Ambulatory Visit (INDEPENDENT_AMBULATORY_CARE_PROVIDER_SITE_OTHER): Payer: 59 | Admitting: Psychiatry

## 2018-10-28 ENCOUNTER — Encounter (HOSPITAL_COMMUNITY): Payer: Self-pay | Admitting: Psychiatry

## 2018-10-28 DIAGNOSIS — F410 Panic disorder [episodic paroxysmal anxiety] without agoraphobia: Secondary | ICD-10-CM

## 2018-10-28 DIAGNOSIS — F331 Major depressive disorder, recurrent, moderate: Secondary | ICD-10-CM | POA: Diagnosis not present

## 2018-10-28 DIAGNOSIS — F411 Generalized anxiety disorder: Secondary | ICD-10-CM

## 2018-10-28 MED ORDER — ESCITALOPRAM OXALATE 20 MG PO TABS: 10.0000 mg | ORAL_TABLET | Freq: Every day | ORAL | 1 refills | Status: AC
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
## 2018-11-29 ENCOUNTER — Ambulatory Visit (HOSPITAL_COMMUNITY): Payer: 59 | Admitting: Psychiatry

## 2019-06-13 IMAGING — CT CT ANGIO CHEST
2 of 6 series · 18 of 46 positions shown · IV contrast (APPLIED)
Comparison: Radiographs earlier this day.

CLINICAL DATA: Shortness of breath for 2 weeks. Cough. Elevated
D-dimer.

EXAM:
CT ANGIOGRAPHY CHEST WITH CONTRAST
TECHNIQUE: Multidetector CT imaging of the chest was performed using the
standard protocol during bolus administration of intravenous
contrast. Multiplanar CT image reconstructions and MIPs were
obtained to evaluate the vascular anatomy.
CONTRAST:  75 cc Isovue 370 IV

[Series 5: thins · axial · 0.71mm/px · z∈[+500,+752]mm · 16 of 276 slices shown]
[im 12/276  lung]
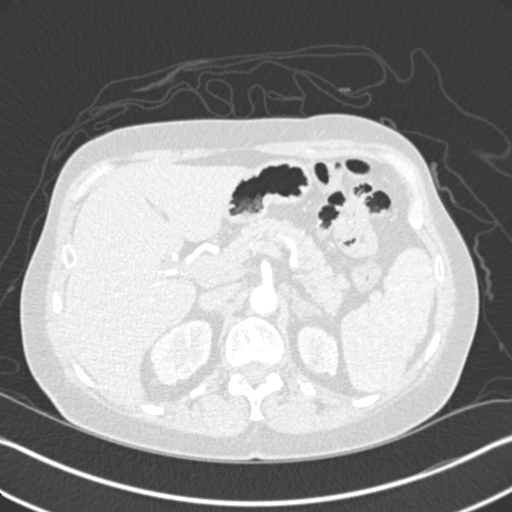
[im 36/276  soft-tissue]
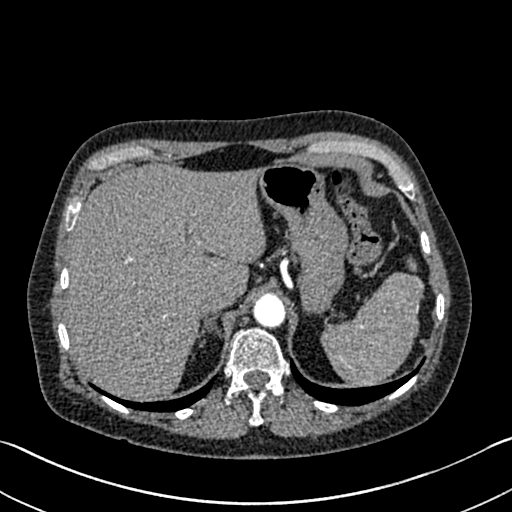
[im 48/276  lung]
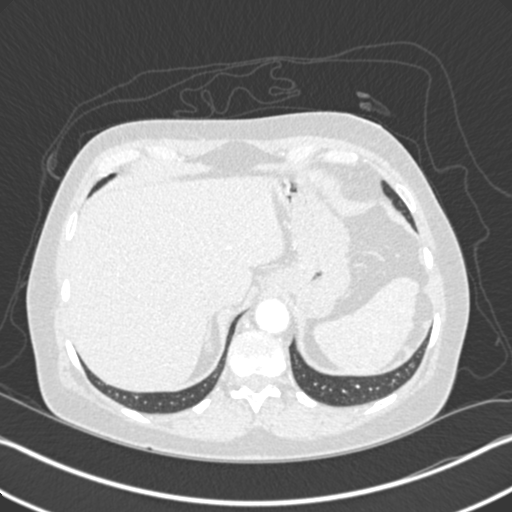
[im 60/276  soft-tissue]
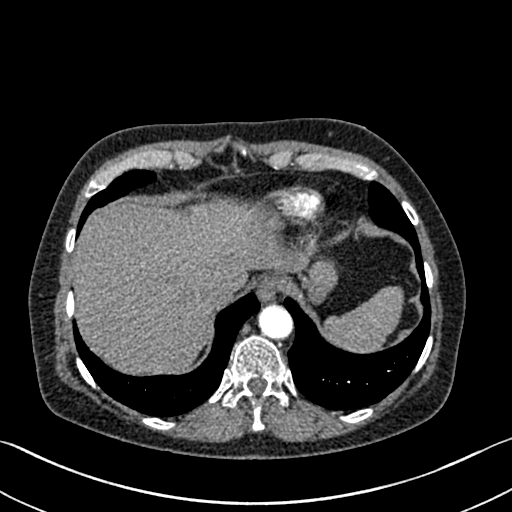
[im 84/276  lung]
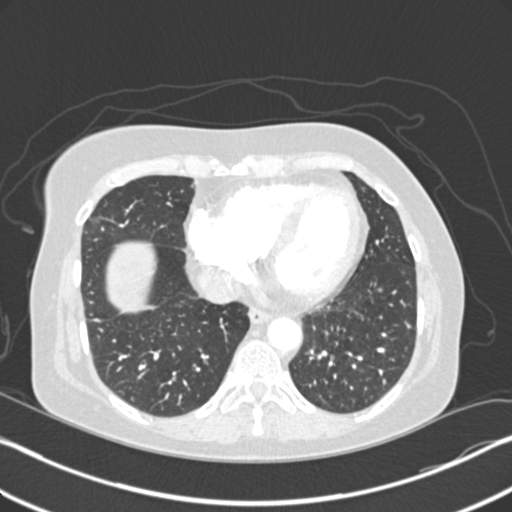
[im 96/276  soft-tissue]
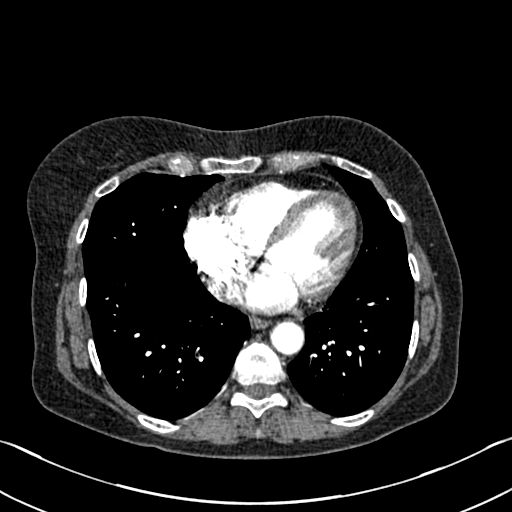
[im 108/276  lung]
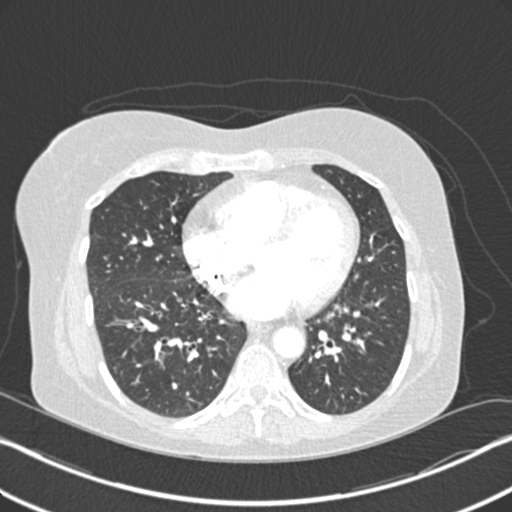
[im 132/276  soft-tissue]
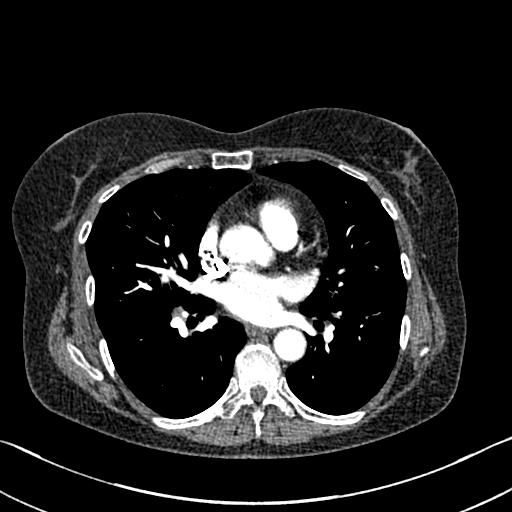
[im 144/276  lung]
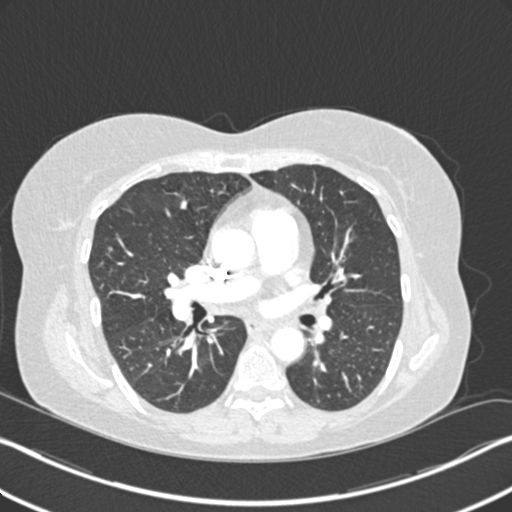
[im 168/276  soft-tissue]
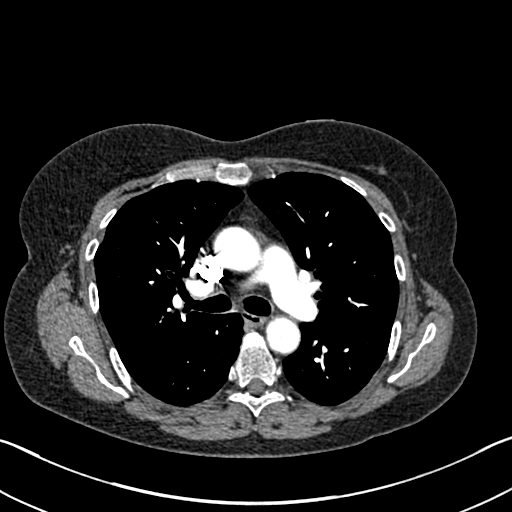
[im 180/276  lung]
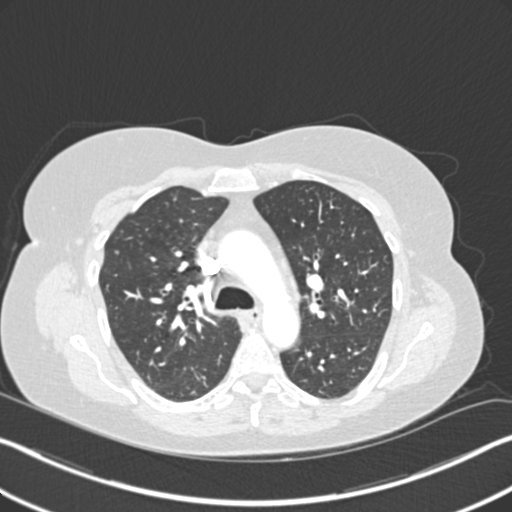
[im 192/276  soft-tissue]
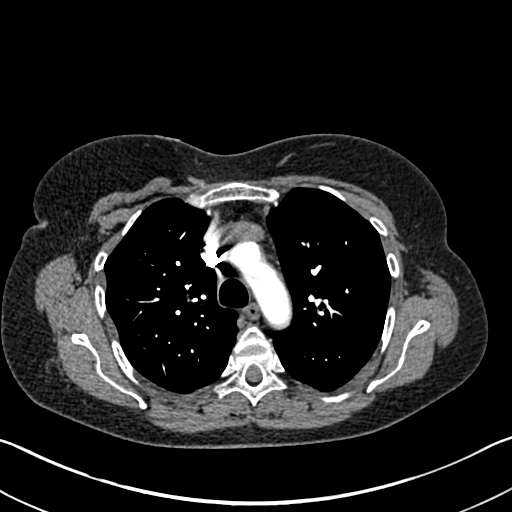
[im 216/276  lung]
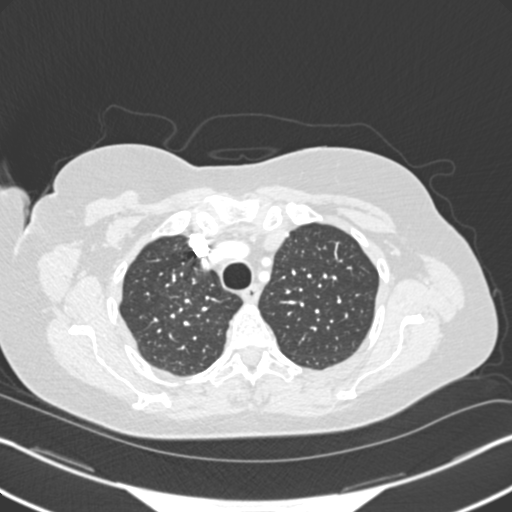
[im 228/276  soft-tissue]
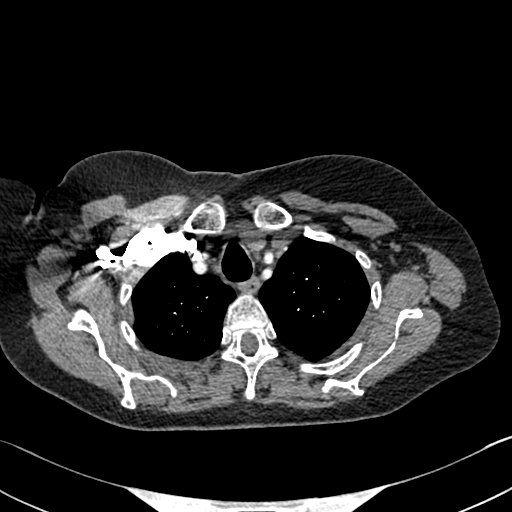
[im 240/276  lung]
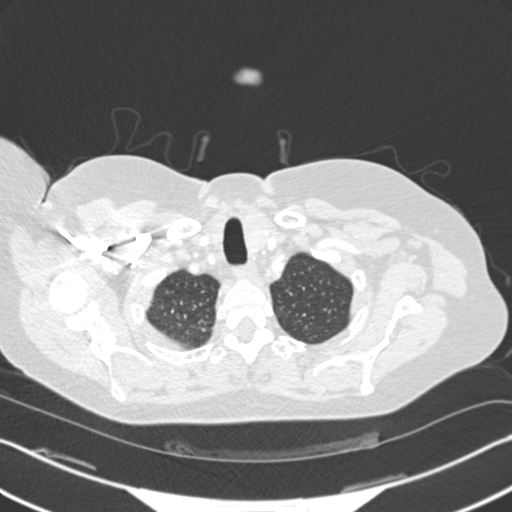
[im 264/276  soft-tissue]
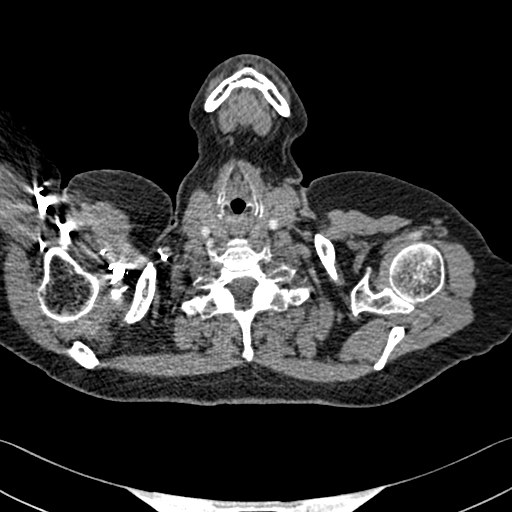

[Series 7: coronal mpr · coronal · 0.54mm/px · 2 of 79 slices shown]
[im 27/79  soft-tissue]
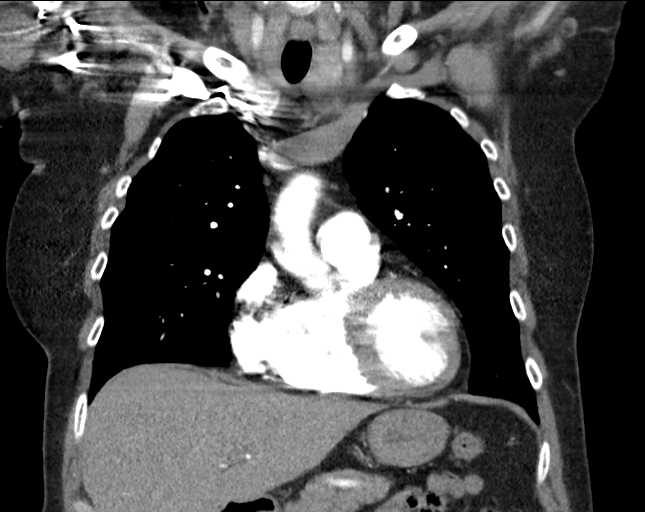
[im 53/79  soft-tissue]
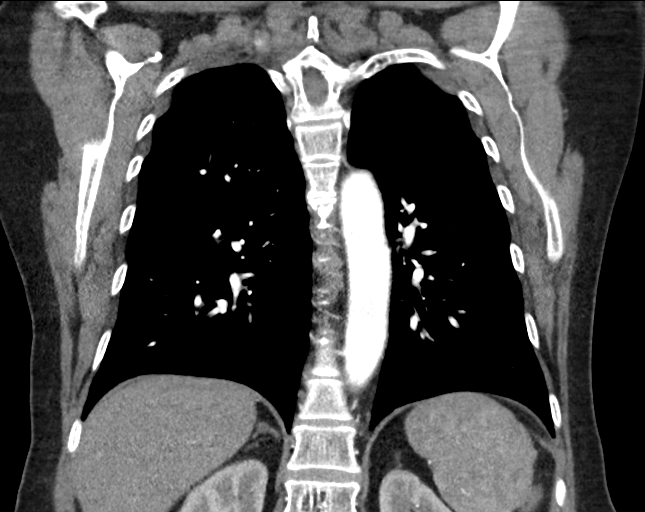

[18 of 46 positions shown; findings below may reference images not displayed]

FINDINGS: Cardiovascular: There are no filling defects within the pulmonary
arteries to suggest pulmonary embolus. The thoracic aorta is normal
in caliber. No aortic dissection. The heart is normal in size. No
pericardial effusion.

Mediastinum/Nodes: No mediastinal or hilar adenopathy. No axillary
adenopathy. Low-density nodule in the left lobe of the thyroid, has
been previously assessed with ultrasound (for example 11/28/2014).
The esophagus is decompressed. Trachea and mainstem bronchi are
patent.

Lungs/Pleura: Lungs are clear. No consolidation. No pulmonary edema.
No pleural effusion. No pulmonary mass or suspicious nodule.

Upper Abdomen: Low-density nodule left adrenal gland consistent with
adenoma. Postcholecystectomy. Left renal cyst.

Musculoskeletal: There are no acute or suspicious osseous
abnormalities. Vertebral body hemangioma in L1.

Review of the MIP images confirms the above findings.
IMPRESSION: 1. No pulmonary embolus.
2. No acute intrathoracic abnormality.

## 2019-06-20 DIAGNOSIS — R454 Irritability and anger: Secondary | ICD-10-CM | POA: Diagnosis not present

## 2019-06-20 DIAGNOSIS — J449 Chronic obstructive pulmonary disease, unspecified: Secondary | ICD-10-CM | POA: Diagnosis not present

## 2019-06-27 DIAGNOSIS — Z03818 Encounter for observation for suspected exposure to other biological agents ruled out: Secondary | ICD-10-CM | POA: Diagnosis not present

## 2019-06-27 DIAGNOSIS — Z20828 Contact with and (suspected) exposure to other viral communicable diseases: Secondary | ICD-10-CM | POA: Diagnosis not present

## 2019-06-28 DIAGNOSIS — Z20828 Contact with and (suspected) exposure to other viral communicable diseases: Secondary | ICD-10-CM | POA: Diagnosis not present

## 2019-07-14 ENCOUNTER — Ambulatory Visit: Payer: Commercial Managed Care - HMO | Admitting: Medical

## 2019-08-02 ENCOUNTER — Encounter: Payer: Self-pay | Admitting: Medical

## 2019-08-02 ENCOUNTER — Ambulatory Visit (INDEPENDENT_AMBULATORY_CARE_PROVIDER_SITE_OTHER): Payer: BC Managed Care – PPO | Admitting: Medical

## 2019-08-02 ENCOUNTER — Other Ambulatory Visit: Payer: Self-pay

## 2019-08-02 VITALS — BP 138/80 | HR 73 | Temp 97.7°F | Ht 64.0 in | Wt 163.4 lb

## 2019-08-02 DIAGNOSIS — Z87891 Personal history of nicotine dependence: Secondary | ICD-10-CM | POA: Diagnosis not present

## 2019-08-02 DIAGNOSIS — Z7185 Encounter for immunization safety counseling: Secondary | ICD-10-CM

## 2019-08-02 DIAGNOSIS — J449 Chronic obstructive pulmonary disease, unspecified: Secondary | ICD-10-CM | POA: Diagnosis not present

## 2019-08-02 DIAGNOSIS — Z129 Encounter for screening for malignant neoplasm, site unspecified: Secondary | ICD-10-CM

## 2019-08-02 DIAGNOSIS — Z7189 Other specified counseling: Secondary | ICD-10-CM | POA: Diagnosis not present

## 2019-08-02 MED ORDER — ALBUTEROL SULFATE HFA 108 (90 BASE) MCG/ACT IN AERS: INHALATION_SPRAY | RESPIRATORY_TRACT | 0 refills | Status: DC

## 2019-08-02 MED ORDER — MONTELUKAST SODIUM 10 MG PO TABS: 10.0000 mg | ORAL_TABLET | Freq: Every day | ORAL | 0 refills | Status: DC

## 2019-08-02 MED ORDER — BUDESONIDE-FORMOTEROL FUMARATE 160-4.5 MCG/ACT IN AERO: 2.0000 | INHALATION_SPRAY | Freq: Every day | RESPIRATORY_TRACT | 0 refills | Status: AC
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
## 2019-08-02 NOTE — Patient Instructions (Signed)
Preventative Care for Adults - Female   Thank you for coming in for your well visit today, and thank you for trusting [place] with your care!   Maintain regular health and wellness exams:  A routine yearly physical is a good way to check in with your primary care provider about your health and preventive screening. It is also an opportunity to share updates about your health and any concerns you have, and receive a thorough all-over exam.   Most health insurance companies pay for at least some preventative services.  Check with your health plan for specific coverages.  What preventative services do women need?  Adult women should have their weight and blood pressure checked regularly.   Women age 23 and older should have their cholesterol levels checked regularly.  Women should be screened for cervical cancer with a Pap smear and pelvic exam beginning at either age 46, or 3 years after they become sexually activity.    Breast cancer screening generally begins at age 80 with a mammogram and breast exam by your primary care provider.    Beginning at age 63 and continuing to age 76, women should be screened for colorectal cancer.  Certain people may need continued testing until age 64.  Updating vaccinations is part of preventative care.  Vaccinations help protect against diseases such as the flu.  Osteoporosis is a disease in which the bones lose minerals and strength as we age. Women ages 53 and over should discuss this with their caregivers, as should women after menopause who have other risk factors.  Lab tests are generally done as part of preventative care to screen for anemia and blood disorders, to screen for problems with the kidneys and liver, to screen for bladder problems, to check blood sugar, and to check your cholesterol level.  Preventative services generally include counseling about diet, exercise, avoiding tobacco, drugs, excessive alcohol consumption, and sexually transmitted  infections.    Xrays and CT scans are not normally done as a preventative test, and most insurances do not pay for imaging for screening other than as discussed under cancer screens below.   On the other hand, if you have certain medical concerns, imaging may be necessary as a diagnostic test.   Your Medical Team Your medical team starts with [place], your PCP or primary care provider.  Please use our services for your routine care such as physicals, screenings, immunizations, sick visits, and your first stop for general medical concerns.  You can call our number for after hours information for urgent questions that may need attention but cannot wait til the next business day.    Urgent care-urgent cares exist to provide care when your primary care office would typically be closed such as evenings or weekends.   Urgent care is for evaluation of urgent medical problems that do not necessarily require emergency department care, but cannot wait til the next business day when we are open.  Emergency department care-please reserve emergency department care for serious, urgent, possibly life-threatening medical problems.  This includes issues like possible stroke, heart attack, significant injury, mental health crisis, or other urgent need that requires immediate medical attention.     See your dentist office twice yearly for hygiene and cleaning visits.   Brush your teeth and floss your teeth daily.  See your eye doctor yearly for routine eye exam and screenings for glaucoma and retinal disease.  See your gynecologist yearly if you do not have your female/gynecological exams at our  office.      Vaccines:  Stay up to date with your tetanus shots and other required immunizations. You should have a booster for tetanus every 10 years. Be sure to get your flu shot every year, since 5%-20% of the [place] population comes down with the flu. The flu vaccine changes each year, so being vaccinated once is not enough.  Get your shot in the fall, before the flu season peaks.   Other vaccines to consider:  Pneumococcal vaccine to protect against certain types of pneumonia.  This is normally recommended for adults age 37 or older.  However, adults younger than 59 years old with certain underlying conditions such as diabetes, heart or lung disease should also receive the vaccine.  Shingles vaccine to protect against Varicella Zoster if you are older than age 55, or younger than 59 years old with certain underlying illness.  If you have not had the Shingrix vaccine, please call your insurer to inquire about coverage for the Shingrix vaccine given in 2 doses.   Some insurers cover this vaccine after age 13, some cover this after age 60.  If your insurer covers this, then call to schedule appointment to have this vaccine here  Hepatitis A vaccine to protect against a form of infection of the liver by a virus acquired from food.  Hepatitis B vaccine to protect against a form of infection of the liver by a virus acquired from blood or body fluids, particularly if you work in health care.  If you plan to travel internationally, check with your local health department for specific vaccination recommendations.  Human Papilloma Virus or HPV causes cancer of the cervix, and other infections that can be transmitted from person to person. There is a vaccine for HPV, and males should get immunized between the ages of 27 and 44. It requires a series of 3 shots.   Covid/Coronavirus - as the vaccines are becoming available, please consider vaccination if you are a health care worker, first responder, or have significant health problems such as asthma, COPD, heart disease, hypertension, diabetes, obesity, multiple medical problems, over age 72yo, or immunocompromised.      What should I know about Cancer screening? Many types of cancers can be detected early and may often be prevented. Lung Cancer  You should be screened every  year for lung cancer if: ? You are a current smoker who has smoked for at least 30 years. ? You are a former smoker who has quit within the past 15 years.  Talk to your health care provider about your screening options, when you should start screening, and how often you should be screened.  Breast cancer screening is essential to preventive care for women. All women age 57 and older should perform a breast self-exam every month. At age 70 and older, women should have their caregiver complete a breast exam each year. Women at ages 29 and older should have a mammogram (x-ray film) of the breasts. Your caregiver can discuss how often you need mammograms.    Breast cancer screening   [facility]   [phone]         [phone] [address] [place], [address] [place], [place] [address]        [place], [place] [address]  Cervical cancer screening includes taking a Pap smear (sample of cells examined under a microscope)  from the cervix (end of the uterus). It also includes testing for HPV (Human Papilloma Virus, which can cause cervical cancer). Screening and a pelvic exam should begin at age 32, or 3 years after a woman becomes sexually active. Screening should occur every year, with a Pap smear but no HPV testing, up to age 53. After age 25, you should have a Pap smear every 3 years with HPV testing, if no HPV was found previously.   Colorectal Cancer  Routine colorectal cancer screening usually begins at 59 years of age and should be repeated every 5-10 years until you are 59 years old. You may need to be screened more often if early forms of precancerous polyps or small growths are found. Your health care provider may recommend screening at an earlier age if you have risk factors for colon cancer.  Your health care provider may recommend using home test kits to check for hidden blood in the stool.  A small camera  at the end of a tube can be used to examine your colon (sigmoidoscopy or colonoscopy). This checks for the earliest forms of colorectal cancer.  Skin Cancer  Check your skin from head to toe regularly.  Tell your health care provider about any new moles or changes in moles, especially if: ? There is a change in a mole's size, shape, or color. ? You have a mole that is larger than a pencil eraser.  Always use sunscreen. Apply sunscreen liberally and repeat throughout the day.  Protect yourself by wearing long sleeves, pants, a wide-brimmed hat, and sunglasses when outside.     GENERAL RECOMMENDATIONS FOR GOOD HEALTH:  Healthy diet:  Eat a variety of foods, including fruit, vegetables, animal or vegetable protein, such as meat, fish, chicken, and eggs, or beans, lentils, tofu, and grains, such as rice.  Drink plenty of water daily.  Decrease saturated fat in the diet, avoid lots of red meat, processed foods, sweets, fast foods, and fried foods.  Exercise:  Aerobic exercise helps maintain good heart health. At least 30-40 minutes of moderate-intensity exercise is recommended. For example, a brisk walk that increases your heart rate and breathing. This should be done on most days of the week.   Find a type of exercise or a variety of exercises that you enjoy so that it becomes a part of your daily life.  Examples are running, walking, swimming, water aerobics, and biking.  For motivation and support, explore group exercise such as aerobic class, spin class, Zumba, Yoga,or  martial arts, etc.    Set exercise goals for yourself, such as a certain weight goal, walk or run in a race such as a 5k walk/run.  Speak to your primary care provider about exercise goals.  Your weight readings per our records: Wt Readings from Last 3 Encounters:  08/02/19 163 lb 6.4 oz (74.1 kg)  09/14/18 155 lb (70.3 kg)  11/10/16 157 lb 6.4 oz (71.4 kg)    Body mass index is 28.05 kg/m.    Disease  prevention:  If you smoke or chew tobacco, find out from your caregiver how to quit. It can literally save your life, no matter how long you have been a tobacco user. If you do not use tobacco, never begin.   Maintain a healthy diet and normal weight. Increased weight leads to problems with blood pressure and diabetes.   The Body Mass Index or BMI is a way of measuring how much of your body is fat. Having  a BMI above 27 increases the risk of heart disease, diabetes, hypertension, stroke and other problems related to obesity. Your caregiver can help determine your BMI and based on it develop an exercise and dietary program to help you achieve or maintain this important measurement at a healthful level.  High blood pressure causes heart and blood vessel problems.  Persistent high blood pressure should be treated with medicine if weight loss and exercise do not work.  Your blood pressure readings per our records:     BP Readings from Last 3 Encounters:  08/02/19 138/80  09/14/18 129/63  11/29/16 114/75     Fat and cholesterol leaves deposits in your arteries that can block them. This causes heart disease and vessel disease elsewhere in your body.  If your cholesterol is found to be high, or if you have heart disease or certain other medical conditions, then you may need to have your cholesterol monitored frequently and be treated with medication.   Ask if you should have a cardiac stress test if your history suggests this. A stress test is a test done on a treadmill that looks for heart disease. This test can find disease prior to there being a problem.   Menopause can be associated with physical symptoms and risks. Hormone replacement therapy is available to decrease these. You should talk to your caregiver about whether starting or continuing to take hormones is right for you.   Osteoporosis is a disease in which the bones lose minerals and strength as we age. This can result in serious bone  fractures. Risk of osteoporosis can be identified using a bone density scan. Women ages 54 and over should discuss this with their caregivers, as should women after menopause who have other risk factors. Ask your caregiver whether you should be taking a calcium supplement and Vitamin D, to reduce the rate of osteoporosis.   Osteoporosis screening/bone density testing:  [facility]   [phone]          [phone] [address] [place], [address] [place], [place] [address]        [place], [place] [address]    Avoid drinking alcohol in excess (more than two drinks per day).  Avoid use of street drugs. Do not share needles with anyone. Ask for professional help if you need assistance or instructions on stopping the use of alcohol, cigarettes, and/or drugs.  Brush your teeth twice a day with fluoride toothpaste, and floss once a day. Good oral hygiene prevents tooth decay and gum disease. The problems can be painful, unattractive, and can cause other health problems. Visit your dentist for a routine oral and dental check up and preventive care every 6-12 months.   Safety:  Use seatbelts 100% of the time, whether driving or as a passenger.  Use safety devices such as hearing protection if you work in environments with loud noise or significant background noise.  Use safety glasses when doing any work that could send debris in to the eyes.  Use a helmet if you ride a bike or motorcycle.  Use appropriate safety gear for contact sports.  Talk to your caregiver about gun safety.  Use sunscreen with a SPF (or skin protection factor) of 15 or greater.  Lighter skinned people are at a greater risk of skin cancer. Don't forget to also wear sunglasses in order to protect your  eyes from too much damaging sunlight. Damaging sunlight can accelerate cataract formation.   Keep carbon monoxide and smoke detectors in your home  functioning at all times. Change the batteries every 6 months or use a model that plugs into the wall.    Sexual activity: . Sex is a normal part of life and sexual activity can continue into older adulthood for many healthy people.   . If you are having issues related to sexual activity, please follow up to discuss this further.   . If you are not in a monogamous relationship or have more than one partner, please practice safe sex.  Use condoms. Condoms are used for birth control and to help reduce the spread of sexually transmitted infections (or STIs).  Some of the STIs are gonorrhea (the clap), chlamydia, syphilis, trichomonas, herpes, HPV (human papilloma virus) and HIV (human immunodeficiency virus) which causes AIDS. The herpes, HIV and HPV are viral illnesses that have no cure. These can result in disability, cancer and death.   We are able to test for STIs here at our office.

## 2019-10-05 ENCOUNTER — Telehealth: Payer: Self-pay | Admitting: Medical

## 2019-10-05 NOTE — Telephone Encounter (Signed)
It is not unusual to have a collection of symptoms such as arm soreness, chills, body aches, fatigue, and possibly even low-grade fever for a few days after vaccine.  This is called a vaccine immune response.  Generally the symptoms resolve over the course of 2 to 3 days

## 2019-10-05 NOTE — Telephone Encounter (Signed)
Pt called and states that she had the second COVID shot and she has had a fever over a 100.00 and chills headache and states she feels like she has the flu she wants to know if this is normal. Pt can be reached at [phone], pt did not want to schedule a appt

## 2019-10-06 NOTE — Telephone Encounter (Signed)
Called patient, no answer. Sent patient a message on mychart

## 2019-12-21 ENCOUNTER — Other Ambulatory Visit: Payer: Self-pay | Admitting: Medical

## 2020-02-27 ENCOUNTER — Other Ambulatory Visit: Payer: Self-pay | Admitting: Medical

## 2020-02-27 DIAGNOSIS — J449 Chronic obstructive pulmonary disease, unspecified: Secondary | ICD-10-CM

## 2020-02-27 NOTE — Telephone Encounter (Signed)
Pt. Requesting refill on Albuterol inhaler last apt was 08/02/19 and no future apts.

## 2020-02-27 NOTE — Telephone Encounter (Signed)
Needs physical visit fastin.  I've only seen her once

## 2020-02-29 NOTE — Telephone Encounter (Signed)
Pt. Called back wanting to know why her Albuterol inhaler was denied at the pharmacy. I told her I needed to get her scheduled for a fasting CPE which I got her scheduled for in Jan. She got upset that we denied her medication because she was not scheduled for an apt. She wanted to know since she is scheduled now can she please have a refill on her inhaler.

## 2020-03-19 ENCOUNTER — Other Ambulatory Visit: Payer: Self-pay | Admitting: Medical

## 2020-04-03 ENCOUNTER — Other Ambulatory Visit: Payer: Self-pay | Admitting: Medical

## 2020-05-02 ENCOUNTER — Telehealth: Payer: Self-pay | Admitting: Medical

## 2020-05-02 ENCOUNTER — Encounter: Payer: BC Managed Care – PPO | Admitting: Medical

## 2020-05-02 NOTE — Telephone Encounter (Signed)

## 2020-06-30 ENCOUNTER — Other Ambulatory Visit: Payer: Self-pay | Admitting: Medical

## 2020-06-30 DIAGNOSIS — J449 Chronic obstructive pulmonary disease, unspecified: Secondary | ICD-10-CM

## 2021-12-25 ENCOUNTER — Encounter: Payer: Self-pay | Admitting: Internal Medicine

## 2022-01-28 ENCOUNTER — Encounter: Payer: Self-pay | Admitting: Internal Medicine
